# Patient Record
Sex: Male | Born: 1950
Health system: Southern US, Community
[De-identification: ages and names within clinical notes are randomized; demographics above are authoritative.]

## PROBLEM LIST (undated history)

## (undated) DIAGNOSIS — G4730 Sleep apnea, unspecified: Secondary | ICD-10-CM

## (undated) DIAGNOSIS — I4891 Unspecified atrial fibrillation: Secondary | ICD-10-CM

## (undated) DIAGNOSIS — M1990 Unspecified osteoarthritis, unspecified site: Secondary | ICD-10-CM

## (undated) DIAGNOSIS — K219 Gastro-esophageal reflux disease without esophagitis: Secondary | ICD-10-CM

## (undated) DIAGNOSIS — T8859XA Other complications of anesthesia, initial encounter: Secondary | ICD-10-CM

## (undated) DIAGNOSIS — F419 Anxiety disorder, unspecified: Secondary | ICD-10-CM

## (undated) DIAGNOSIS — I10 Essential (primary) hypertension: Secondary | ICD-10-CM

## (undated) HISTORY — DX: Unspecified atrial fibrillation: I48.91

## (undated) HISTORY — DX: Gastro-esophageal reflux disease without esophagitis: K21.9

---

## 2004-12-05 ENCOUNTER — Emergency Department: Payer: Self-pay | Admitting: Emergency Medicine

## 2004-12-16 ENCOUNTER — Emergency Department: Payer: Self-pay | Admitting: Emergency Medicine

## 2004-12-16 ENCOUNTER — Other Ambulatory Visit: Payer: Self-pay

## 2005-12-26 ENCOUNTER — Emergency Department (HOSPITAL_COMMUNITY): Admission: EM | Admit: 2005-12-26 | Discharge: 2005-12-26 | Payer: Self-pay | Admitting: Emergency Medicine

## 2008-05-04 IMAGING — CR DG FOOT COMPLETE 3+V*L*
3 series · 3 of 3 positions shown · non-contrast
Comparison: none

CLINICAL DATA: Stepped off a dock and heard a rip in foot.  Posterior foot pain from heel to toes.   
 LEFT FOOT - 3 VIEW:

[view not recorded (1 of 3)]
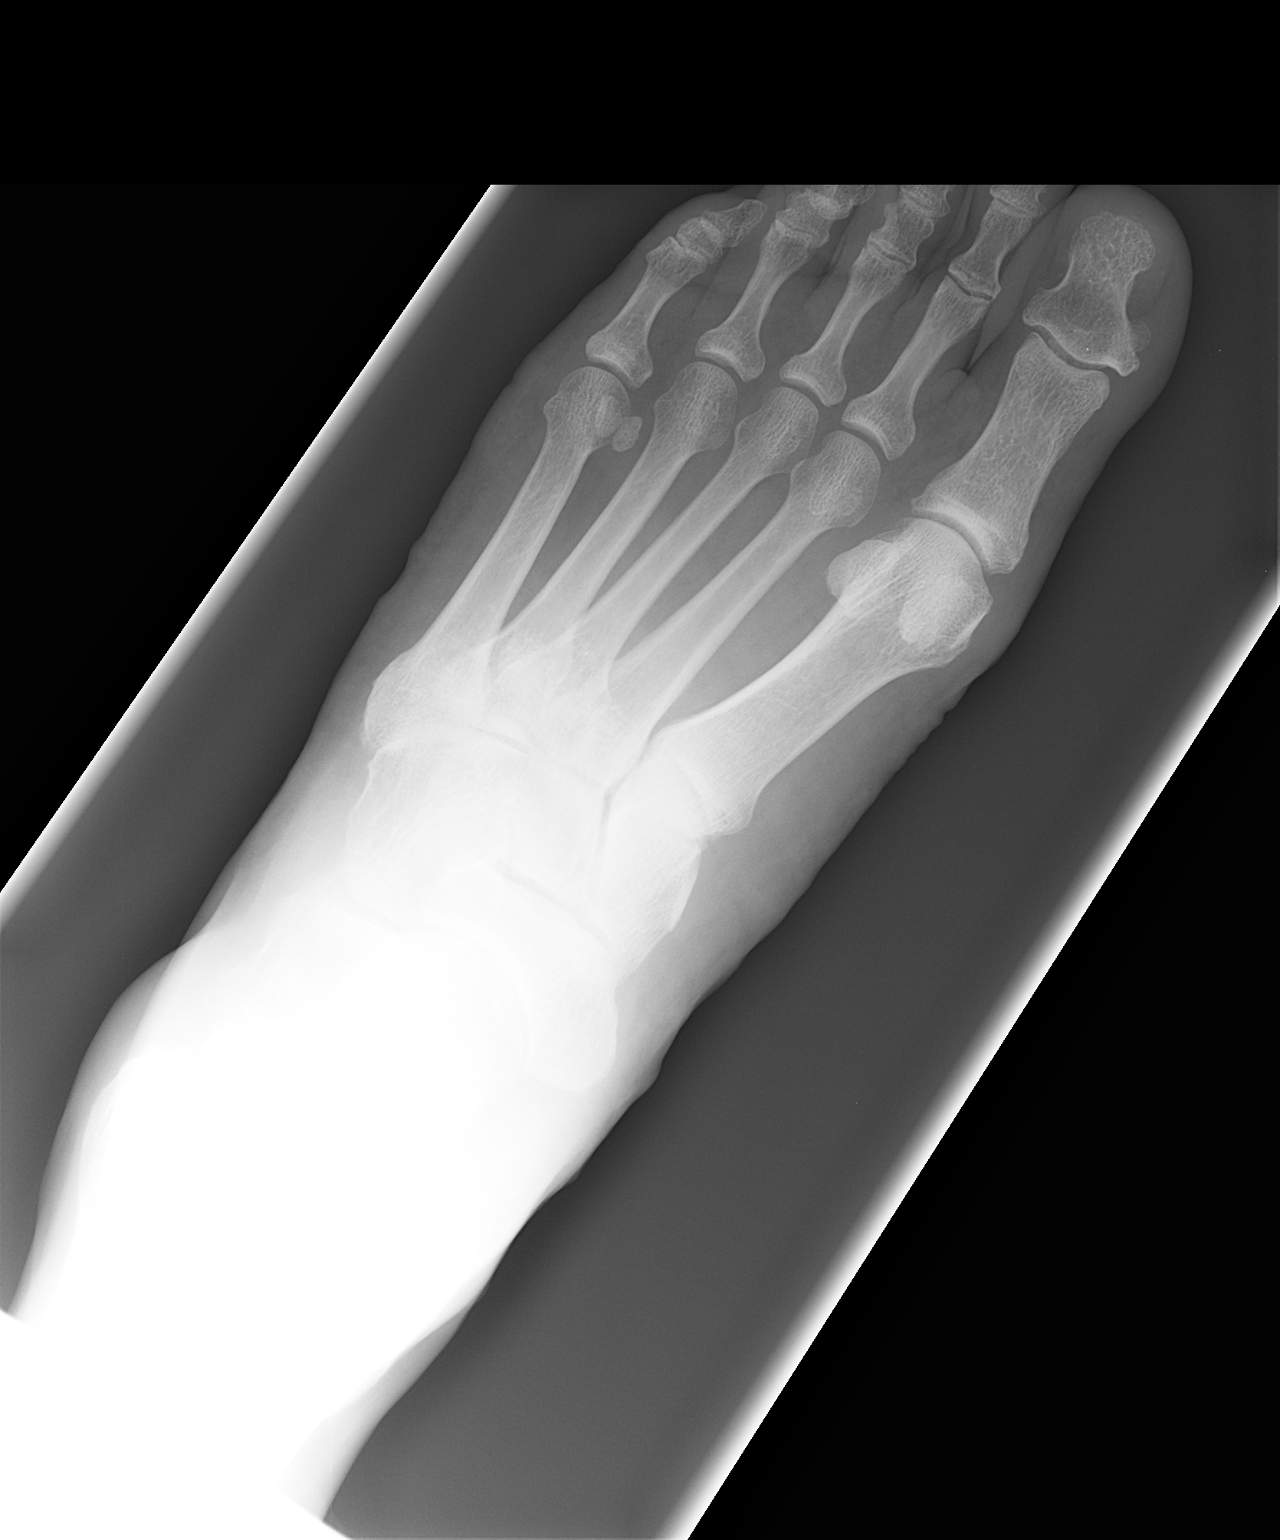

[view not recorded (2 of 3)]
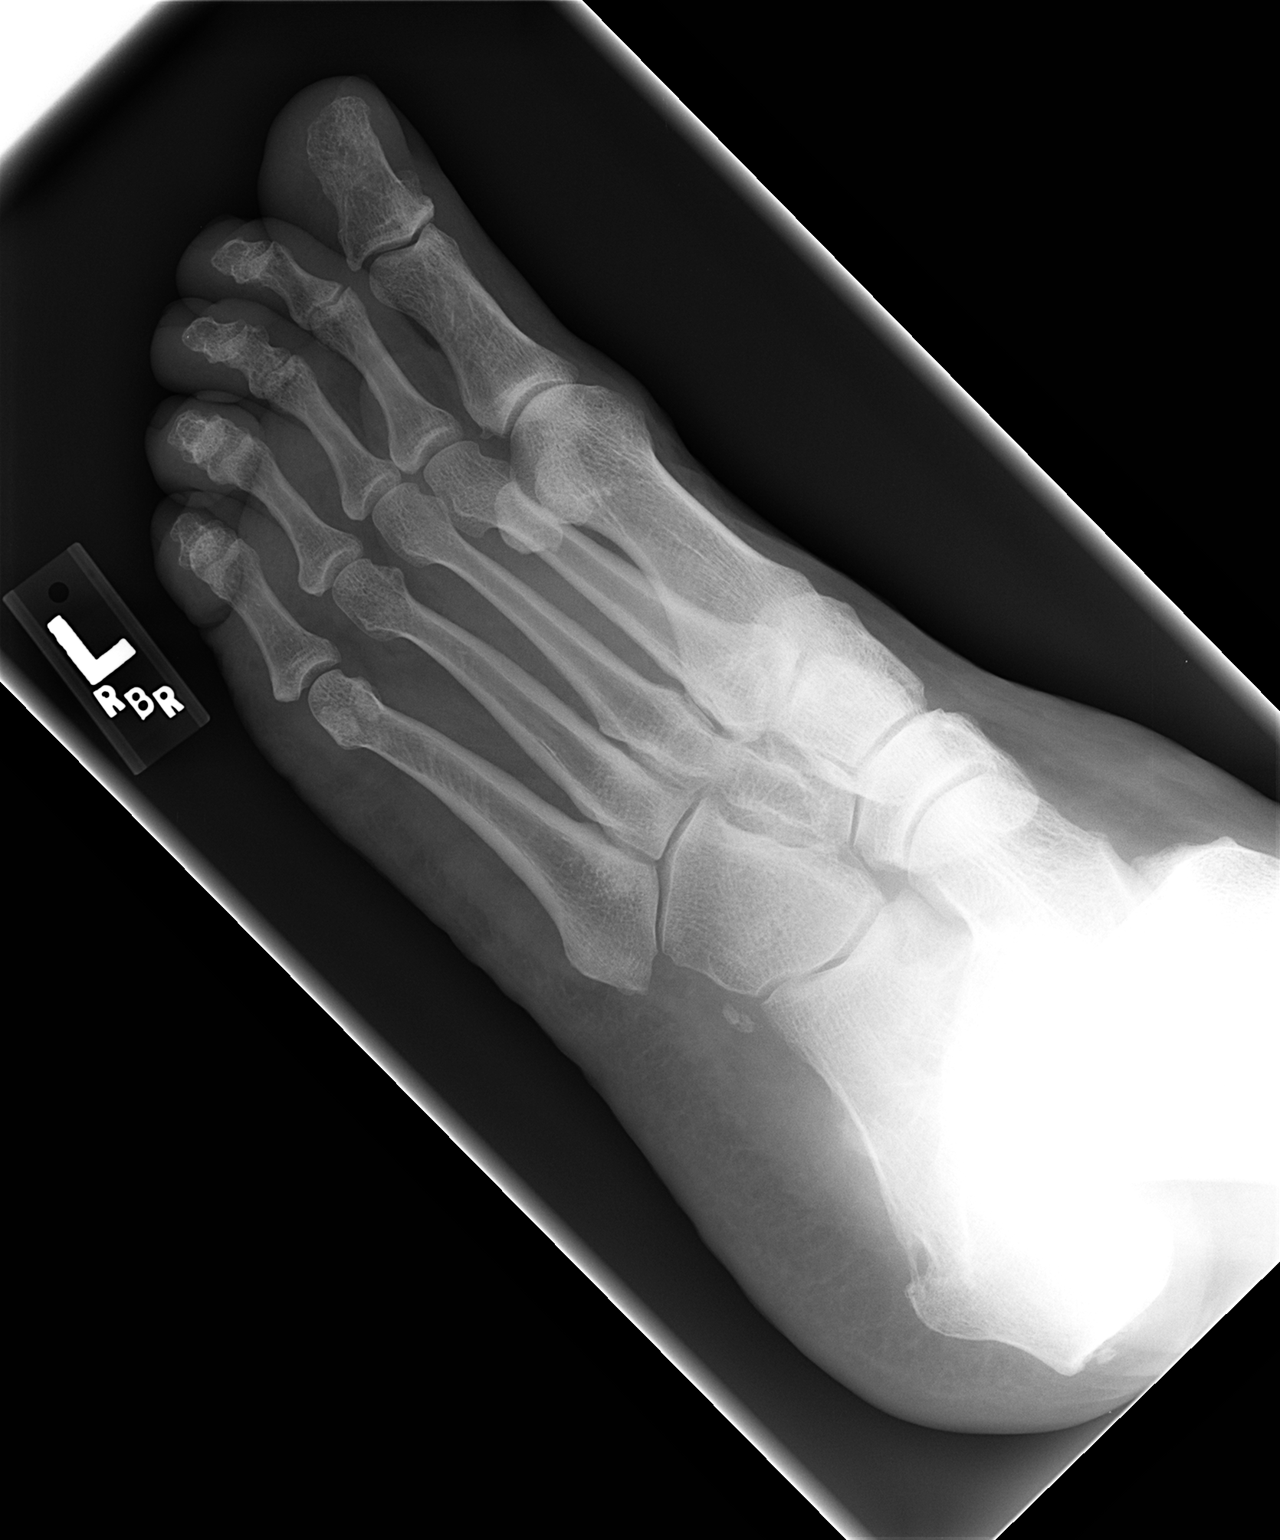

[view not recorded (3 of 3)]
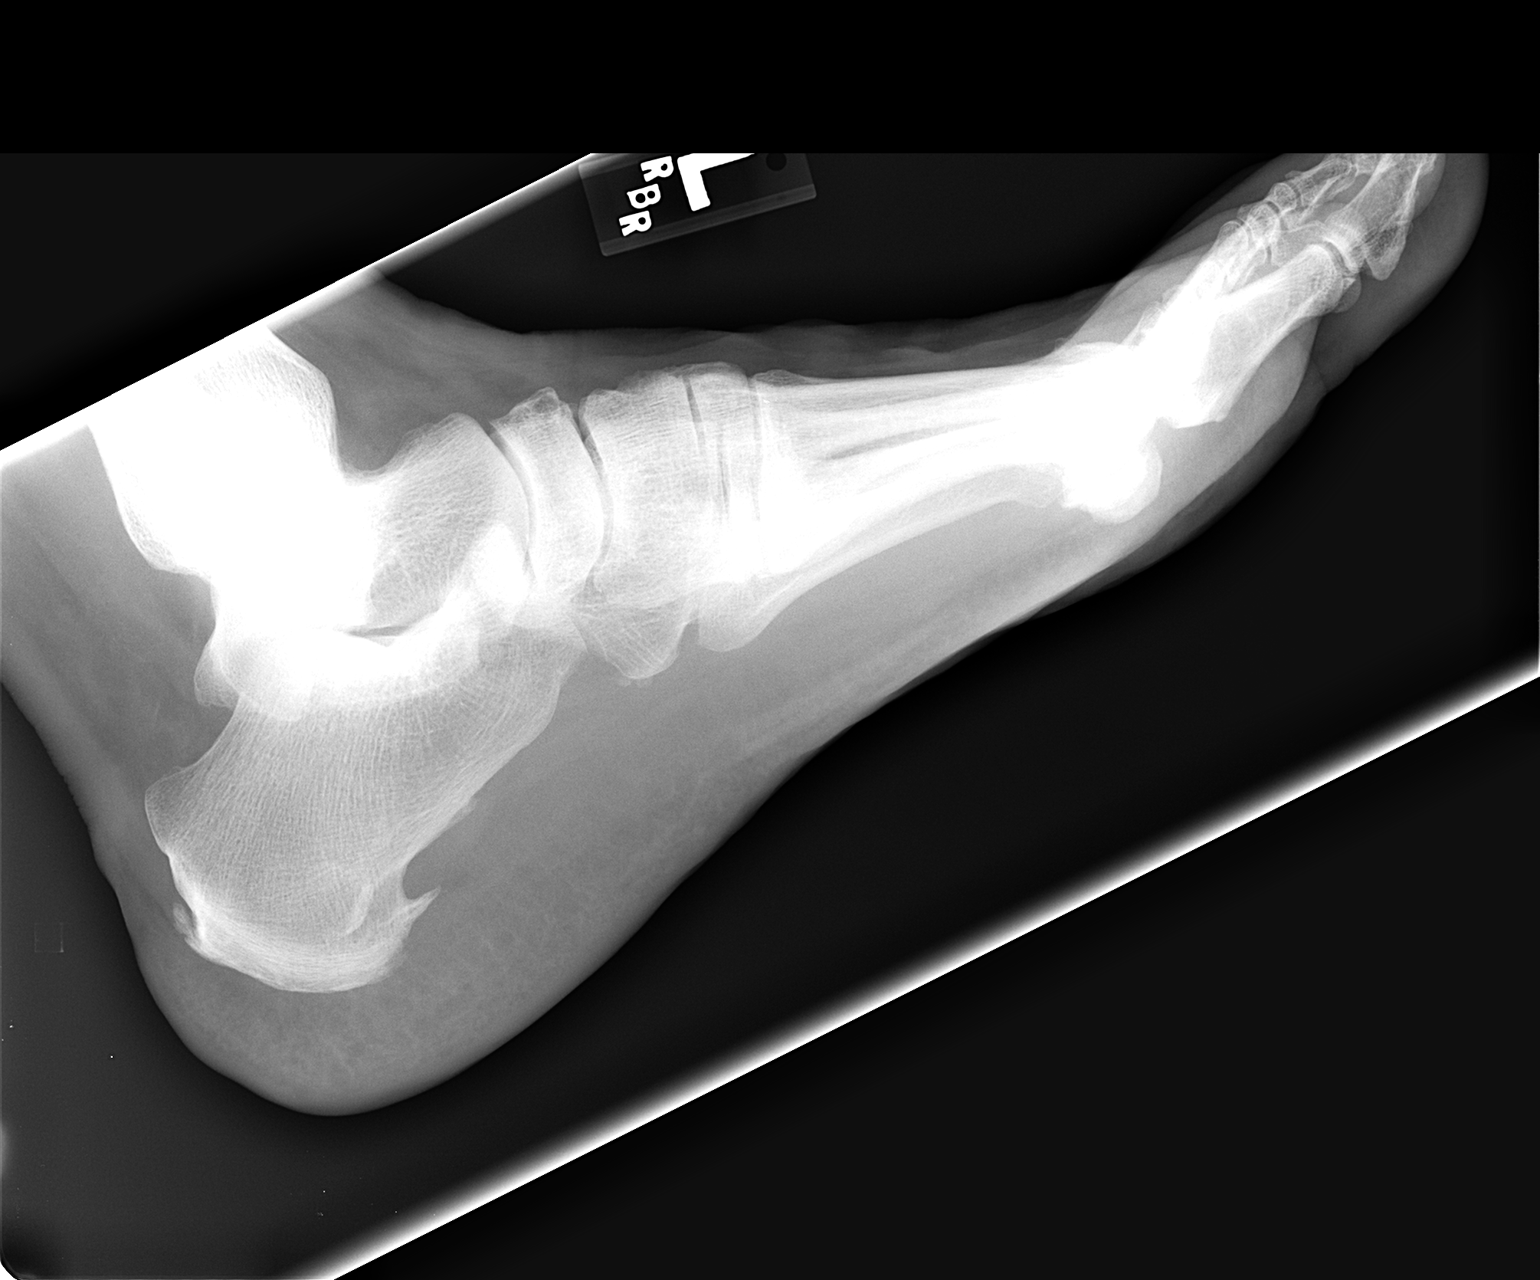

[3 of 3 positions shown; findings below may reference images not displayed]

FINDINGS: There is no acute radiographic abnormality.  Specifically, I see no fracture or dislocation.  If there is a concern for ligamentous injury an MRI should be obtained.   There are calcaneal and plantar heel spurs.
IMPRESSION: No acute osseous abnormalities.

## 2011-01-29 HISTORY — PX: CHOLECYSTECTOMY: SHX55

## 2016-06-09 DIAGNOSIS — R42 Dizziness and giddiness: Secondary | ICD-10-CM | POA: Diagnosis not present

## 2016-06-16 DIAGNOSIS — R42 Dizziness and giddiness: Secondary | ICD-10-CM | POA: Diagnosis not present

## 2016-06-16 DIAGNOSIS — H903 Sensorineural hearing loss, bilateral: Secondary | ICD-10-CM | POA: Diagnosis not present

## 2016-12-15 DIAGNOSIS — L739 Follicular disorder, unspecified: Secondary | ICD-10-CM | POA: Diagnosis not present

## 2016-12-15 DIAGNOSIS — D1801 Hemangioma of skin and subcutaneous tissue: Secondary | ICD-10-CM | POA: Diagnosis not present

## 2016-12-15 DIAGNOSIS — B351 Tinea unguium: Secondary | ICD-10-CM | POA: Diagnosis not present

## 2016-12-15 DIAGNOSIS — D229 Melanocytic nevi, unspecified: Secondary | ICD-10-CM | POA: Diagnosis not present

## 2016-12-15 DIAGNOSIS — L821 Other seborrheic keratosis: Secondary | ICD-10-CM | POA: Diagnosis not present

## 2016-12-15 DIAGNOSIS — B078 Other viral warts: Secondary | ICD-10-CM | POA: Diagnosis not present

## 2016-12-15 DIAGNOSIS — L814 Other melanin hyperpigmentation: Secondary | ICD-10-CM | POA: Diagnosis not present

## 2016-12-15 DIAGNOSIS — D485 Neoplasm of uncertain behavior of skin: Secondary | ICD-10-CM | POA: Diagnosis not present

## 2016-12-15 DIAGNOSIS — L570 Actinic keratosis: Secondary | ICD-10-CM | POA: Diagnosis not present

## 2016-12-15 DIAGNOSIS — B353 Tinea pedis: Secondary | ICD-10-CM | POA: Diagnosis not present

## 2016-12-15 DIAGNOSIS — L578 Other skin changes due to chronic exposure to nonionizing radiation: Secondary | ICD-10-CM | POA: Diagnosis not present

## 2016-12-22 DIAGNOSIS — L739 Follicular disorder, unspecified: Secondary | ICD-10-CM | POA: Diagnosis not present

## 2017-06-06 ENCOUNTER — Ambulatory Visit: Payer: Self-pay | Admitting: Family Medicine

## 2017-06-06 ENCOUNTER — Ambulatory Visit (INDEPENDENT_AMBULATORY_CARE_PROVIDER_SITE_OTHER): Payer: Medicare Other | Admitting: Family Medicine

## 2017-06-06 ENCOUNTER — Encounter: Payer: Self-pay | Admitting: Family Medicine

## 2017-06-06 VITALS — BP 108/60 | HR 70 | Temp 98.8°F | Resp 18 | Ht 71.5 in | Wt 294.0 lb

## 2017-06-06 DIAGNOSIS — M5432 Sciatica, left side: Secondary | ICD-10-CM

## 2017-06-06 MED ORDER — NAPROXEN 500 MG PO TABS: 500.0000 mg | ORAL_TABLET | Freq: Two times a day (BID) | ORAL | 0 refills | Status: AC

## 2017-06-06 MED ORDER — CYCLOBENZAPRINE HCL 5 MG PO TABS: 5.0000 mg | ORAL_TABLET | Freq: Three times a day (TID) | ORAL | 1 refills | Status: AC | PRN

## 2017-06-06 NOTE — Progress Notes (Signed)
Patient: Clinton Hurley Male    DOB: 11-02-1950   67 y.o.   MRN: [id number] Visit Date: 06/06/2017  Today's Provider: Lelon Huh, MD   Chief Complaint  Patient presents with  . Back Pain    x 3 weeks   Subjective:     This is former patient who present today to re-establish and for evaluation of back pain. He has been going to [place] for routine medical care. Was last seen here in January 2016. His medication, social, and family history were reviewed today.   Back Pain  This is a recurrent problem. Episode onset: 3 weeks ago. The problem occurs constantly. The problem has been gradually worsening since onset. The pain is present in the lumbar spine. The quality of the pain is described as shooting and aching. The pain radiates to the left thigh. The pain is severe. Exacerbated by: certain movements and walking. Pertinent negatives include no abdominal pain, chest pain or fever. He has tried nothing for the symptoms. The treatment provided moderate relief.   03/18/2014-seen by Vernie Murders for right sided low back pain. Treated with naproxen and flexeril which was apparently effective. He states current episode is identical to the episode in 2016 and has had no other episodes between.    Allergies not on file   Current Outpatient Medications:  .  alfuzosin (UROXATRAL) 10 MG 24 hr tablet, Take 10 mg by mouth daily., Disp: , Rfl:  .  omeprazole (PRILOSEC) 20 MG capsule, Take 1 capsule by mouth every morning. Take 30 minutes before meal, Disp: , Rfl:  .  sulindac (CLINORIL) 200 MG tablet, Take 200 mg by mouth 2 (two) times daily., Disp: , Rfl:   Review of Systems  Constitutional: Negative for appetite change, chills and fever.  Respiratory: Negative for chest tightness, shortness of breath and wheezing.   Cardiovascular: Negative for chest pain and palpitations.  Gastrointestinal: Negative for abdominal pain, nausea and vomiting.  Musculoskeletal: Positive for back pain and  myalgias (shooting pain in left leg).       Swelling of the back muscles   Active Ambulatory Problems    Diagnosis Date Noted  . No Active Ambulatory Problems   Resolved Ambulatory Problems    Diagnosis Date Noted  . No Resolved Ambulatory Problems   Past Medical History:  Diagnosis Date  . Atrial fibrillation ([place])   . GERD (gastroesophageal reflux disease)    Family History  Problem Relation Age of Onset  . Lung cancer Mother    Family Status  Relation Name Status  . Mother  Deceased  . Father  Alive      Social History   Tobacco Use  . Smoking status: Former Research scientist (life sciences)  . Smokeless tobacco: Never Used  Substance Use Topics  . Alcohol use: Not Currently   Objective:   BP 108/60 (BP Location: Left Arm, Patient Position: Sitting, Cuff Size: Large)   Pulse 70   Temp 98.8 F (37.1 C) (Oral)   Resp 18   Ht 5' 11.5" (1.816 m)   Wt 294 lb (133.4 kg)   SpO2 95% Comment: room air  BMI 40.43 kg/m     Physical Exam  General appearance: alert, well developed, well nourished, cooperative and in no distress Head: Normocephalic, without obvious abnormality, atraumatic Respiratory: Respirations even and unlabored, normal respiratory rate MS: Tender left para lumbar muscles. No spine tenderness. +5 strength both LEs.      Assessment & Plan:  1. Sciatica of left side  - cyclobenzaprine (FLEXERIL) 5 MG tablet; Take 1-2 tablets (5-10 mg total) by mouth 3 (three) times daily as needed for up to 10 days (back pain).  Dispense: 30 tablet; Refill: 1 - naproxen (NAPROSYN) 500 MG tablet; Take 1 tablet (500 mg total) by mouth 2 (two) times daily with a meal for 10 days.  Dispense: 20 tablet; Refill: 0  Call if symptoms change or if not rapidly improving.          Lelon Huh, MD  [facility]

## 2017-06-06 NOTE — Patient Instructions (Signed)
Call if you are not doing much better in 3-4 days   Sciatica Sciatica is pain, numbness, weakness, or tingling along your sciatic nerve. The sciatic nerve starts in the lower back and goes down the back of each leg. Sciatica happens when this nerve is pinched or has pressure put on it. Sciatica usually goes away on its own or with treatment. Sometimes, sciatica may keep coming back (recur). Follow these instructions at home: Medicines  Take over-the-counter and prescription medicines only as told by your doctor.  Do not drive or use heavy machinery while taking prescription pain medicine. Managing pain  If directed, put ice on the affected area. ? Put ice in a plastic bag. ? Place a towel between your skin and the bag. ? Leave the ice on for 20 minutes, 2-3 times a day.  After icing, apply heat to the affected area before you exercise or as often as told by your doctor. Use the heat source that your doctor tells you to use, such as a moist heat pack or a heating pad. ? Place a towel between your skin and the heat source. ? Leave the heat on for 20-30 minutes. ? Remove the heat if your skin turns bright red. This is especially important if you are unable to feel pain, heat, or cold. You may have a greater risk of getting burned. Activity  Return to your normal activities as told by your doctor. Ask your doctor what activities are safe for you. ? Avoid activities that make your sciatica worse.  Take short rests during the day. Rest in a lying or standing position. This is usually better than sitting to rest. ? When you rest for a long time, do some physical activity or stretching between periods of rest. ? Avoid sitting for a long time without moving. Get up and move around at least one time each hour.  Exercise and stretch regularly, as told by your doctor.  Do not lift anything that is heavier than 10 lb (4.5 kg) while you have symptoms of sciatica. ? Avoid lifting heavy things even  when you do not have symptoms. ? Avoid lifting heavy things over and over.  When you lift objects, always lift in a way that is safe for your body. To do this, you should: ? Bend your knees. ? Keep the object close to your body. ? Avoid twisting. General instructions  Use good posture. ? Avoid leaning forward when you are sitting. ? Avoid hunching over when you are standing.  Stay at a healthy weight.  Wear comfortable shoes that support your feet. Avoid wearing high heels.  Avoid sleeping on a mattress that is too soft or too hard. You might have less pain if you sleep on a mattress that is firm enough to support your back.  Keep all follow-up visits as told by your doctor. This is important. Contact a doctor if:  You have pain that: ? Wakes you up when you are sleeping. ? Gets worse when you lie down. ? Is worse than the pain you have had in the past. ? Lasts longer than 4 weeks.  You lose weight for without trying. Get help right away if:  You cannot control when you pee (urinate) or poop (have a bowel movement).  You have weakness in any of these areas and it gets worse. ? Lower back. ? Lower belly (pelvis). ? Butt (buttocks). ? Legs.  You have redness or swelling of your back.  You have  a burning feeling when you pee. This information is not intended to replace advice given to you by your health care provider. Make sure you discuss any questions you have with your health care provider. Document Released: 11/24/2007 Document Revised: 07/23/2015 Document Reviewed: 10/24/2014 Elsevier Interactive Patient Education  [employer].

## 2017-11-15 ENCOUNTER — Ambulatory Visit (INDEPENDENT_AMBULATORY_CARE_PROVIDER_SITE_OTHER): Payer: Medicare Other | Admitting: Family Medicine

## 2017-11-15 ENCOUNTER — Encounter: Payer: Self-pay | Admitting: Family Medicine

## 2017-11-15 VITALS — BP 136/74 | HR 70 | Temp 99.0°F | Resp 16 | Ht 73.0 in | Wt 299.0 lb

## 2017-11-15 DIAGNOSIS — L559 Sunburn, unspecified: Secondary | ICD-10-CM

## 2017-11-15 NOTE — Patient Instructions (Signed)
1.Topical Caladryl once daily.  2. Continue Aloe and cool compresses.  3. Start Zyrtec 10mg  daily as needed. You can get this over the counter.  4. Start Benadryl 25mg  every 4 hours as needed for itching.

## 2017-11-15 NOTE — Progress Notes (Signed)
Patient: Clinton Hurley Male    DOB: Jul 09, 1950   67 y.o.   MRN: [id number] Visit Date: 11/15/2017  Today's Provider: Wilhemena Durie, MD   Chief Complaint  Patient presents with  . Rash   Subjective:    Rash  This is a new problem. The current episode started in the past 7 days. The problem has been gradually worsening since onset. The affected locations include the left arm, right arm, left lower leg, right lower leg and neck. The rash is characterized by dryness, itchiness and redness. Associated with: sun exposure. Pertinent negatives include no cough or shortness of breath. Past treatments include anti-itch cream (and aloe). The treatment provided mild relief.       No Known Allergies   Current Outpatient Medications:  .  alfuzosin (UROXATRAL) 10 MG 24 hr tablet, Take 10 mg by mouth daily., Disp: , Rfl:  .  atorvastatin (LIPITOR) 40 MG tablet, Take 1 tablet by mouth daily., Disp: , Rfl:  .  clonazePAM (KLONOPIN) 1 MG tablet, Take 1 mg by mouth 2 (two) times daily., Disp: , Rfl:  .  dronedarone (MULTAQ) 400 MG tablet, Take 400 mg by mouth 2 (two) times daily with a meal., Disp: , Rfl:  .  omeprazole (PRILOSEC) 20 MG capsule, Take 1 capsule by mouth every morning. Take 30 minutes before meal, Disp: , Rfl:  .  sulindac (CLINORIL) 200 MG tablet, Take 200 mg by mouth 2 (two) times daily., Disp: , Rfl:   Review of Systems  Constitutional: Negative.   Eyes: Negative.   Respiratory: Negative for cough and shortness of breath.   Cardiovascular: Negative for chest pain, palpitations and leg swelling.  Endocrine: Negative.   Musculoskeletal: Negative for arthralgias and gait problem.  Skin: Positive for rash.  Allergic/Immunologic: Negative.   Psychiatric/Behavioral: Negative.     Social History   Tobacco Use  . Smoking status: Former Research scientist (life sciences)  . Smokeless tobacco: Never Used  Substance Use Topics  . Alcohol use: Not Currently   Objective:   BP 136/74 (BP  Location: Right Arm, Patient Position: Sitting, Cuff Size: Large)   Pulse 70   Temp 99 F (37.2 C)   Resp 16   Ht 6\' 1"  (1.854 m)   Wt 299 lb (135.6 kg)   SpO2 95%   BMI 39.45 kg/m  Vitals:   11/15/17 1614  BP: 136/74  Pulse: 70  Resp: 16  Temp: 99 F (37.2 C)  SpO2: 95%  Weight: 299 lb (135.6 kg)  Height: 6\' 1"  (1.854 m)     Physical Exam  Constitutional: He is oriented to person, place, and time. He appears well-developed and well-nourished.  HENT:  Head: Normocephalic and atraumatic.  Eyes: Conjunctivae are normal.  Neck: No thyromegaly present.  Cardiovascular: Normal rate, regular rhythm and normal heart sounds.  Pulmonary/Chest: Effort normal.  Musculoskeletal: He exhibits no edema.  Lymphadenopathy:    He has no cervical adenopathy.  Neurological: He is alert and oriented to person, place, and time.  Skin: Skin is warm and dry.  Very mild,nonspecific rash in areas noted in HPI.Erythematous with a slight amount of blistering.  Psychiatric: He has a normal mood and affect. His behavior is normal. Judgment and thought content normal.        Assessment & Plan:     1. Sunburn First degree. Treat topically with cool compresses,aloe,caladryl and prn benadryl.      I have done the exam and reviewed  the above chart and it is accurate to the best of my knowledge. Development worker, community has been used in this note in any air is in the dictation or transcription are unintentional.  Wilhemena Durie, MD  [place]

## 2018-03-02 ENCOUNTER — Encounter: Payer: Self-pay | Admitting: Podiatry

## 2018-03-02 ENCOUNTER — Ambulatory Visit (INDEPENDENT_AMBULATORY_CARE_PROVIDER_SITE_OTHER): Payer: Medicare Other | Admitting: Podiatry

## 2018-03-02 VITALS — BP 154/85 | HR 70

## 2018-03-02 DIAGNOSIS — L600 Ingrowing nail: Secondary | ICD-10-CM

## 2018-03-02 DIAGNOSIS — M79676 Pain in unspecified toe(s): Secondary | ICD-10-CM | POA: Diagnosis not present

## 2018-03-02 DIAGNOSIS — B351 Tinea unguium: Secondary | ICD-10-CM | POA: Diagnosis not present

## 2018-03-05 NOTE — Progress Notes (Signed)
   Subjective: Patient presents today for evaluation of pain to the medial border of the right hallux that began 6-8 months ago. Patient is concerned for possible ingrown nail. Applying pressure to the nail increases the pain. He has not done anything for treatment. Patient presents today for further treatment and evaluation.  Past Medical History:  Diagnosis Date  . Atrial fibrillation ([place])   . GERD (gastroesophageal reflux disease)     Objective:  General: Well developed, nourished, in no acute distress, alert and oriented x3   Dermatology: Skin is warm, dry and supple bilateral. Medial border of the right hallux appears to be erythematous with evidence of an ingrowing nail. Pain on palpation noted to the border of the nail fold. The remaining nails appear unremarkable at this time. There are no open sores, lesions.  Vascular: Dorsalis Pedis artery and Posterior Tibial artery pedal pulses palpable. No lower extremity edema noted.   Neruologic: Grossly intact via light touch bilateral.  Musculoskeletal: Muscular strength within normal limits in all groups bilateral. Normal range of motion noted to all pedal and ankle joints.   Assesement: #1 Paronychia with ingrowing nail medial border right hallux  #2 Pain in toe #3 Incurvated nail  Plan of Care:  1. Patient evaluated.  2. Mechanical debridement of the right great toenail performed using a nail nipper. Filed with dremel without incident.  3. Return to clinic as needed.     Edrick Kins, DPM [facility]  Dr. Edrick Kins, [place]                                        [place], [place] [address]                Office [phone]  Fax [phone]

## 2018-10-01 ENCOUNTER — Other Ambulatory Visit: Payer: Self-pay

## 2018-11-14 DIAGNOSIS — M1611 Unilateral primary osteoarthritis, right hip: Secondary | ICD-10-CM | POA: Diagnosis not present

## 2018-11-14 DIAGNOSIS — M25561 Pain in right knee: Secondary | ICD-10-CM | POA: Diagnosis not present

## 2018-11-14 DIAGNOSIS — M2391 Unspecified internal derangement of right knee: Secondary | ICD-10-CM | POA: Diagnosis not present

## 2019-08-12 ENCOUNTER — Ambulatory Visit: Payer: Medicare Other | Admitting: Family Medicine

## 2019-08-12 ENCOUNTER — Other Ambulatory Visit: Payer: Self-pay

## 2019-08-12 VITALS — BP 123/52 | HR 73 | Temp 97.3°F | Ht 73.0 in | Wt 289.2 lb

## 2019-08-12 DIAGNOSIS — Z024 Encounter for examination for driving license: Secondary | ICD-10-CM | POA: Diagnosis not present

## 2019-08-12 LAB — POCT URINALYSIS DIPSTICK
Bilirubin, UA: NEGATIVE
Glucose, UA: NEGATIVE
Ketones, UA: NEGATIVE
Leukocytes, UA: NEGATIVE
Nitrite, UA: NEGATIVE
Protein, UA: NEGATIVE
Spec Grav, UA: 1.015 (ref 1.010–1.025)
Urobilinogen, UA: 0.2 E.U./dL
pH, UA: 5 (ref 5.0–8.0)

## 2019-08-12 NOTE — Patient Instructions (Addendum)
DOT Certificate provided for 1 year.  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  Cardiac clearance received from Dr. Raylene Everts ([facility]for his [facility] fibrillation  CPAP compliance letter received from Glennon Hamilton, NP with the [facility]

## 2019-08-12 NOTE — Assessment & Plan Note (Addendum)
DOT PE certificate provided x 1 year

## 2019-08-12 NOTE — Progress Notes (Addendum)
Subjective:    Patient ID: Clinton Hurley, male    DOB: 1950-03-16, 69 y.o.   MRN: [id number]  Clinton Hurley is a 69 y.o. male presenting on 08/12/2019 for Employment Physical (DOT Physical)   HPI  Mr. Gaxiola presents to clinic for DOT PE.  No flowsheet data found.  Social History   Tobacco Use  . Smoking status: Former Research scientist (life sciences)  . Smokeless tobacco: Never Used  Substance Use Topics  . Alcohol use: Not Currently  . Drug use: Never    Review of Systems  Constitutional: Negative.   HENT: Negative.   Eyes: Negative.   Respiratory: Negative.   Cardiovascular: Negative.   Gastrointestinal: Negative.   Endocrine: Negative.   Genitourinary: Negative.   Musculoskeletal: Negative.   Skin: Negative.   Allergic/Immunologic: Negative.   Neurological: Negative.   Hematological: Negative.   Psychiatric/Behavioral: Negative.    Per HPI unless specifically indicated above     Objective:    BP (!) 123/52 (BP Location: Left Arm, Patient Position: Sitting, Cuff Size: Large)   Pulse 73   Temp (!) 97.3 F (36.3 C) (Temporal)   Ht 6\' 1"  (1.854 m)   Wt 289 lb 3.2 oz (131.2 kg)   BMI 38.16 kg/m   Wt Readings from Last 3 Encounters:  08/12/19 289 lb 3.2 oz (131.2 kg)  11/15/17 299 lb (135.6 kg)  06/06/17 294 lb (133.4 kg)    Physical Exam Vitals reviewed.  Constitutional:      General: He is not in acute distress.    Appearance: Normal appearance. He is well-developed and well-groomed. He is obese. He is not ill-appearing or toxic-appearing.  HENT:     Head: Normocephalic.     Right Ear: Tympanic membrane, ear canal and external ear normal. There is no impacted cerumen.     Left Ear: Tympanic membrane, ear canal and external ear normal. There is no impacted cerumen.     Nose: Nose normal. No congestion or rhinorrhea.     Mouth/Throat:     Mouth: Mucous membranes are moist.     Pharynx: Oropharynx is clear. No oropharyngeal exudate or posterior oropharyngeal erythema.   Eyes:     General: Lids are normal. Vision grossly intact. No scleral icterus.       Right eye: No discharge.        Left eye: No discharge.     Extraocular Movements: Extraocular movements intact.     Conjunctiva/sclera: Conjunctivae normal.     Pupils: Pupils are equal, round, and reactive to light.  Cardiovascular:     Rate and Rhythm: Normal rate and regular rhythm.     Pulses: Normal pulses.          Dorsalis pedis pulses are 2+ on the right side and 2+ on the left side.     Heart sounds: Normal heart sounds. No murmur heard.  No friction rub. No gallop.   Pulmonary:     Effort: Pulmonary effort is normal. No respiratory distress.     Breath sounds: Normal breath sounds. No wheezing, rhonchi or rales.  Abdominal:     General: Abdomen is flat. Bowel sounds are normal. There is no distension.     Palpations: Abdomen is soft. There is no hepatomegaly, splenomegaly or mass.     Tenderness: There is no abdominal tenderness. There is no right CVA tenderness, left CVA tenderness, guarding or rebound.     Hernia: No hernia is present.  Musculoskeletal:  General: Normal range of motion.     Cervical back: Normal range of motion and neck supple. No rigidity or tenderness.     Right lower leg: No edema.     Left lower leg: No edema.  Feet:     Right foot:     Skin integrity: Skin integrity normal.     Left foot:     Skin integrity: Skin integrity normal.  Lymphadenopathy:     Cervical: No cervical adenopathy.  Skin:    General: Skin is warm and dry.     Capillary Refill: Capillary refill takes less than 2 seconds.  Neurological:     General: No focal deficit present.     Mental Status: He is alert and oriented to person, place, and time.     Cranial Nerves: Cranial nerves are intact. No cranial nerve deficit.     Sensory: Sensation is intact. No sensory deficit.     Motor: Motor function is intact. No weakness.     Coordination: Coordination is intact. Coordination normal.      Gait: Gait is intact. Gait normal.     Deep Tendon Reflexes: Reflexes are normal and symmetric. Reflexes normal.  Psychiatric:        Attention and Perception: Attention and perception normal.        Mood and Affect: Mood and affect normal.        Speech: Speech normal.        Behavior: Behavior normal. Behavior is cooperative.        Thought Content: Thought content normal.        Cognition and Memory: Cognition and memory normal.        Judgment: Judgment normal.     Results for orders placed or performed in visit on 08/12/19  POCT Urinalysis Dipstick  Result Value Ref Range   Color, UA Yellow    Clarity, UA clear    Glucose, UA Negative Negative   Bilirubin, UA negative    Ketones, UA negative    Spec Grav, UA 1.015 1.010 - 1.025   Blood, UA trace    pH, UA 5.0 5.0 - 8.0   Protein, UA Negative Negative   Urobilinogen, UA 0.2 0.2 or 1.0 E.U./dL   Nitrite, UA negative    Leukocytes, UA Negative Negative   Appearance     Odor        Assessment & Plan:   Problem List Items Addressed This Visit      Other   Encounter for commercial driving license (CDL) exam    DOT PE certificate provided x 1 year       Other Visit Diagnoses    Encounter for CDL (commercial driving license) exam    -  Primary   Relevant Orders   POCT Urinalysis Dipstick (Completed)      No orders of the defined types were placed in this encounter.     Follow up plan: Return in about 1 year (around 08/11/2020) for DOT PE.   Harlin Rain, [place] [facility] Nurse Practitioner [facility] 08/22/2019, 9:40 AM

## 2020-03-19 DIAGNOSIS — M25561 Pain in right knee: Secondary | ICD-10-CM | POA: Diagnosis not present

## 2020-03-19 DIAGNOSIS — M25551 Pain in right hip: Secondary | ICD-10-CM | POA: Diagnosis not present

## 2020-12-15 ENCOUNTER — Encounter (INDEPENDENT_AMBULATORY_CARE_PROVIDER_SITE_OTHER): Payer: Self-pay

## 2020-12-15 ENCOUNTER — Ambulatory Visit (INDEPENDENT_AMBULATORY_CARE_PROVIDER_SITE_OTHER): Payer: Medicare Other | Admitting: Podiatry

## 2020-12-15 ENCOUNTER — Other Ambulatory Visit: Payer: Self-pay

## 2020-12-15 DIAGNOSIS — M79675 Pain in left toe(s): Secondary | ICD-10-CM | POA: Diagnosis not present

## 2020-12-15 DIAGNOSIS — M79674 Pain in right toe(s): Secondary | ICD-10-CM | POA: Diagnosis not present

## 2020-12-15 DIAGNOSIS — B351 Tinea unguium: Secondary | ICD-10-CM

## 2020-12-15 NOTE — Progress Notes (Signed)
   SUBJECTIVE Patient presents to office today complaining of elongated, thickened nails that cause pain while ambulating in shoes especially to bilateral great toes.  Patient is unable to trim their own nails. Patient is here for further evaluation and treatment.    Past Medical History:  Diagnosis Date   Atrial fibrillation ([place])    GERD (gastroesophageal reflux disease)     OBJECTIVE General Patient is awake, alert, and oriented x 3 and in no acute distress. Derm Skin is dry and supple bilateral. Negative open lesions or macerations. Remaining integument unremarkable. Nails are tender, long, thickened and dystrophic with subungual debris, consistent with onychomycosis, bilateral great toes. No signs of infection noted. Vasc  DP and PT pedal pulses palpable bilaterally. Temperature gradient within normal limits.  Neuro Epicritic and protective threshold sensation grossly intact bilaterally.  Musculoskeletal Exam No symptomatic pedal deformities noted bilateral. Muscular strength within normal limits.  ASSESSMENT 1.  Pain due to onychomycosis of toenails both  PLAN OF CARE 1. Patient evaluated today.  2. Instructed to maintain good pedal hygiene and foot care.  3. Mechanical debridement of nails bilateral great toes performed using a nail nipper. Filed with dremel without incident.  4. Return to clinic in 3 mos.    Edrick Kins, DPM [facility]  Dr. Edrick Kins, DPM    [address], [place] [address]                Office [phone]  Fax [phone]

## 2020-12-23 DIAGNOSIS — H2513 Age-related nuclear cataract, bilateral: Secondary | ICD-10-CM | POA: Diagnosis not present

## 2020-12-23 DIAGNOSIS — H43812 Vitreous degeneration, left eye: Secondary | ICD-10-CM | POA: Diagnosis not present

## 2021-01-12 DIAGNOSIS — B078 Other viral warts: Secondary | ICD-10-CM | POA: Diagnosis not present

## 2021-01-12 DIAGNOSIS — D2262 Melanocytic nevi of left upper limb, including shoulder: Secondary | ICD-10-CM | POA: Diagnosis not present

## 2021-01-12 DIAGNOSIS — D225 Melanocytic nevi of trunk: Secondary | ICD-10-CM | POA: Diagnosis not present

## 2021-01-12 DIAGNOSIS — L821 Other seborrheic keratosis: Secondary | ICD-10-CM | POA: Diagnosis not present

## 2021-01-12 DIAGNOSIS — D2261 Melanocytic nevi of right upper limb, including shoulder: Secondary | ICD-10-CM | POA: Diagnosis not present

## 2021-01-12 DIAGNOSIS — D485 Neoplasm of uncertain behavior of skin: Secondary | ICD-10-CM | POA: Diagnosis not present

## 2021-01-26 DIAGNOSIS — H2513 Age-related nuclear cataract, bilateral: Secondary | ICD-10-CM | POA: Diagnosis not present

## 2021-01-26 DIAGNOSIS — H43812 Vitreous degeneration, left eye: Secondary | ICD-10-CM | POA: Diagnosis not present

## 2021-12-08 NOTE — Patient Instructions (Addendum)
SURGICAL WAITING ROOM VISITATION Patients having surgery or a procedure may have no more than 2 support people in the waiting area - these visitors may rotate.   Children under the age of 40 must have an adult with them who is not the patient. If the patient needs to stay at the hospital during part of their recovery, the visitor guidelines for inpatient rooms apply. Pre-op nurse will coordinate an appropriate time for 1 support person to accompany patient in pre-op.  This support person may not rotate.    Please refer to the [facility] website for the visitor guidelines for Inpatients (after your surgery is over and you are in a regular room).    Your procedure is scheduled on: 12/21/21   Report to [facility] Main Entrance    Report to admitting at 9:00 AM   Call this number if you have problems the morning of surgery [phone]   Do not eat food :After Midnight.   After Midnight you may have the following liquids until 8:30 AM DAY OF SURGERY  Water Non-Citrus Juices (without pulp, NO RED) Carbonated Beverages Black Coffee (NO MILK/CREAM OR CREAMERS, sugar ok)  Clear Tea (NO MILK/CREAM OR CREAMERS, sugar ok) regular and decaf                             Plain Jell-O (NO RED)                                           Fruit ices (not with fruit pulp, NO RED)                                     Popsicles (NO RED)                                                               Sports drinks like Gatorade (NO RED)                 The day of surgery:  Drink ONE (1) Pre-Surgery Clear Ensure at 8:30 AM the morning of surgery. Drink in one sitting. Do not sip.  This drink was given to you during your hospital  pre-op appointment visit. Nothing else to drink after completing the  Pre-Surgery Clear Ensure.          If you have questions, please contact your surgeon's office.   FOLLOW BOWEL PREP AND ANY ADDITIONAL PRE OP INSTRUCTIONS YOU RECEIVED FROM YOUR SURGEON'S  OFFICE!!!     Oral Hygiene is also important to reduce your risk of infection.                                    Remember - BRUSH YOUR TEETH THE MORNING OF SURGERY WITH YOUR REGULAR TOOTHPASTE   Take these medicines the morning of surgery with A SIP OF WATER: Clonazepam, Omeprazole   These are anesthesia recommendations for holding your anticoagulants.  Please contact your prescribing physician to confirm  IF it is safe to hold your anticoagulants for this length of time.   Eliquis Apixaban   72 hours   Xarelto Rivaroxaban   72 hours  Plavix Clopidogrel   120 hours  Pletal Cilostazol   120 hours    Bring CPAP mask and tubing day of surgery.                              You may not have any metal on your body including jewelry, and body piercing             Do not wear lotions, powders, cologne, or deodorant              Men may shave face and neck.   Do not bring valuables to the hospital. [place].   Bring small overnight bag day of surgery.   DO NOT [place]. PHARMACY WILL DISPENSE MEDICATIONS LISTED ON YOUR MEDICATION LIST TO YOU DURING YOUR ADMISSION [place]!              Please read over the following fact sheets you were given: IF [place] [phone] [id number] Schneiders   If you received a COVID test during your pre-op visit  it is requested that you wear a mask when out in public, stay away from anyone that may not be feeling well and notify your surgeon if you develop symptoms. If you test positive for Covid or have been in contact with anyone that has tested positive in the last 10 days please notify you surgeon.     [place] - Preparing for Surgery Before surgery, you can play an important role.  Because skin is not sterile, your skin needs to be as free of germs as possible.  You can reduce the number of germs on your skin by  washing with CHG (chlorahexidine gluconate) soap before surgery.  CHG is an antiseptic cleaner which kills germs and bonds with the skin to continue killing germs even after washing. Please DO NOT use if you have an allergy to CHG or antibacterial soaps.  If your skin becomes reddened/irritated stop using the CHG and inform your nurse when you arrive at Short Stay. Do not shave (including legs and underarms) for at least 48 hours prior to the first CHG shower.  You may shave your face/neck.  Please follow these instructions carefully:  1.  Shower with CHG Soap the night before surgery and the  morning of surgery.  2.  If you choose to wash your hair, wash your hair first as usual with your normal  shampoo.  3.  After you shampoo, rinse your hair and body thoroughly to remove the shampoo.                             4.  Use CHG as you would any other liquid soap.  You can apply chg directly to the skin and wash.  Gently with a scrungie or clean washcloth.  5.  Apply the CHG Soap to your body ONLY FROM THE NECK DOWN.   Do   not use on face/ open  Wound or open sores. Avoid contact with eyes, ears mouth and   genitals (private parts).                       Wash face,  Genitals (private parts) with your normal soap.             6.  Wash thoroughly, paying special attention to the area where your    surgery  will be performed.  7.  Thoroughly rinse your body with warm water from the neck down.  8.  DO NOT shower/wash with your normal soap after using and rinsing off the CHG Soap.                9.  Pat yourself dry with a clean towel.            10.  Wear clean pajamas.            11.  Place clean sheets on your bed the night of your first shower and do not  sleep with pets. Day of Surgery : Do not apply any lotions/deodorants the morning of surgery.  Please wear clean clothes to the hospital/surgery center.  FAILURE TO FOLLOW THESE INSTRUCTIONS MAY RESULT IN THE CANCELLATION  OF YOUR SURGERY  PATIENT SIGNATURE_________________________________  NURSE SIGNATURE__________________________________  ________________________________________________________________________   Adam Phenix  An incentive spirometer is a tool that can help keep your lungs clear and active. This tool measures how well you are filling your lungs with each breath. Taking long deep breaths may help reverse or decrease the chance of developing breathing (pulmonary) problems (especially infection) following: A long period of time when you are unable to move or be active. BEFORE THE PROCEDURE  If the spirometer includes an indicator to show your best effort, your nurse or respiratory therapist will set it to a desired goal. If possible, sit up straight or lean slightly forward. Try not to slouch. Hold the incentive spirometer in an upright position. INSTRUCTIONS FOR USE  Sit on the edge of your bed if possible, or sit up as far as you can in bed or on a chair. Hold the incentive spirometer in an upright position. Breathe out normally. Place the mouthpiece in your mouth and seal your lips tightly around it. Breathe in slowly and as deeply as possible, raising the piston or the ball toward the top of the column. Hold your breath for 3-5 seconds or for as long as possible. Allow the piston or ball to fall to the bottom of the column. Remove the mouthpiece from your mouth and breathe out normally. Rest for a few seconds and repeat Steps 1 through 7 at least 10 times every 1-2 hours when you are awake. Take your time and take a few normal breaths between deep breaths. The spirometer may include an indicator to show your best effort. Use the indicator as a goal to work toward during each repetition. After each set of 10 deep breaths, practice coughing to be sure your lungs are clear. If you have an incision (the cut made at the time of surgery), support your incision when coughing by placing a  pillow or rolled up towels firmly against it. Once you are able to get out of bed, walk around indoors and cough well. You may stop using the incentive spirometer when instructed by your caregiver.  RISKS AND COMPLICATIONS Take your time so you do not get dizzy or light-headed. If you are in pain, you  may need to take or ask for pain medication before doing incentive spirometry. It is harder to take a deep breath if you are having pain. AFTER USE Rest and breathe slowly and easily. It can be helpful to keep track of a log of your progress. Your caregiver can provide you with a simple table to help with this. If you are using the spirometer at home, follow these instructions: [place] IF:  You are having difficultly using the spirometer. You have trouble using the spirometer as often as instructed. Your pain medication is not giving enough relief while using the spirometer. You develop fever of 100.5 F (38.1 C) or higher. SEEK IMMEDIATE MEDICAL CARE IF:  You cough up bloody sputum that had not been present before. You develop fever of 102 F (38.9 C) or greater. You develop worsening pain at or near the incision site. MAKE SURE YOU:  Understand these instructions. Will watch your condition. Will get help right away if you are not doing well or get worse. Document Released: 06/27/2006 Document Revised: 05/09/2011 Document Reviewed: 08/28/2006 ExitCare Patient Information 2014 ExitCare, [place].   ________________________________________________________________________  WHAT IS A BLOOD TRANSFUSION? Blood Transfusion Information  A transfusion is the replacement of blood or some of its parts. Blood is made up of multiple cells which provide different functions. Red blood cells carry oxygen and are used for blood loss replacement. White blood cells fight against infection. Platelets control bleeding. Plasma helps clot blood. Other blood products are available for specialized  needs, such as hemophilia or other clotting disorders. BEFORE THE TRANSFUSION  Who gives blood for transfusions?  Healthy volunteers who are fully evaluated to make sure their blood is safe. This is blood bank blood. Transfusion therapy is the safest it has ever been in the practice of medicine. Before blood is taken from a donor, a complete history is taken to make sure that person has no history of diseases nor engages in risky social behavior (examples are intravenous drug use or sexual activity with multiple partners). The donor's travel history is screened to minimize risk of transmitting infections, such as malaria. The donated blood is tested for signs of infectious diseases, such as HIV and hepatitis. The blood is then tested to be sure it is compatible with you in order to minimize the chance of a transfusion reaction. If you or a relative donates blood, this is often done in anticipation of surgery and is not appropriate for emergency situations. It takes many days to process the donated blood. RISKS AND COMPLICATIONS Although transfusion therapy is very safe and saves many lives, the main dangers of transfusion include:  Getting an infectious disease. Developing a transfusion reaction. This is an allergic reaction to something in the blood you were given. Every precaution is taken to prevent this. The decision to have a blood transfusion has been considered carefully by your caregiver before blood is given. Blood is not given unless the benefits outweigh the risks. AFTER THE TRANSFUSION Right after receiving a blood transfusion, you will usually feel much better and more energetic. This is especially true if your red blood cells have gotten low (anemic). The transfusion raises the level of the red blood cells which carry oxygen, and this usually causes an energy increase. The nurse administering the transfusion will monitor you carefully for complications. HOME CARE INSTRUCTIONS  No special  instructions are needed after a transfusion. You may find your energy is better. Speak with your caregiver about any limitations on  activity for underlying diseases you may have. SEEK MEDICAL CARE IF:  Your condition is not improving after your transfusion. You develop redness or irritation at the intravenous (IV) site. SEEK IMMEDIATE MEDICAL CARE IF:  Any of the following symptoms occur over the next 12 hours: Shaking chills. You have a temperature by mouth above 102 F (38.9 C), not controlled by medicine. Chest, back, or muscle pain. People around you feel you are not acting correctly or are confused. Shortness of breath or difficulty breathing. Dizziness and fainting. You get a rash or develop hives. You have a decrease in urine output. Your urine turns a dark color or changes to pink, red, or brown. Any of the following symptoms occur over the next 10 days: You have a temperature by mouth above 102 F (38.9 C), not controlled by medicine. Shortness of breath. Weakness after normal activity. The white part of the eye turns yellow (jaundice). You have a decrease in the amount of urine or are urinating less often. Your urine turns a dark color or changes to pink, red, or brown. Document Released: 02/12/2000 Document Revised: 05/09/2011 Document Reviewed: 10/01/2007 [facility] Patient Information 2014 [place], [place].  _______________________________________________________________________

## 2021-12-08 NOTE — Progress Notes (Addendum)
COVID Vaccine Completed: yes  Date of COVID positive in last 90 days: no  [facility] [place]  Chest x-ray - n/a EKG - yes in last month [facility] req Stress Test - 5 years ago per pt ECHO - yes [facility] last month req Cardiac Cath - n/a Pacemaker/ICD device last checked: n/a Spinal Cord Stimulator: n/a  Bowel Prep - no  Sleep Study - yes,  CPAP - yes every night  Fasting Blood Sugar - n/a Checks Blood Sugar _____ times a day  Blood Thinner Instructions: Eliquis, hold 3 days Aspirin Instructions: Last Dose:  Activity level: Can go up a flight of stairs and perform activities of daily living without stopping and without symptoms of chest pain or shortness of breath.     Anesthesia review: a fib, HTN, OSA, urinary cath currently from [place] pt states will be removed in 4 days  Patient denies shortness of breath, fever, cough and chest pain at PAT appointment  Patient verbalized understanding of instructions that were given to them at the PAT appointment. Patient was also instructed that they will need to review over the PAT instructions again at home before surgery.

## 2021-12-09 ENCOUNTER — Encounter (HOSPITAL_COMMUNITY): Payer: Self-pay

## 2021-12-09 ENCOUNTER — Encounter (HOSPITAL_COMMUNITY)
Admission: RE | Admit: 2021-12-09 | Discharge: 2021-12-09 | Disposition: A | Discharge status: Home / Self Care | Payer: Medicare Other | Source: Ambulatory Visit | Attending: Orthopedic Surgery | Admitting: Orthopedic Surgery

## 2021-12-09 DIAGNOSIS — I2510 Atherosclerotic heart disease of native coronary artery without angina pectoris: Secondary | ICD-10-CM

## 2021-12-09 DIAGNOSIS — Z01818 Encounter for other preprocedural examination: Secondary | ICD-10-CM

## 2021-12-09 HISTORY — DX: Anxiety disorder, unspecified: F41.9

## 2021-12-09 HISTORY — DX: Sleep apnea, unspecified: G47.30

## 2021-12-09 HISTORY — DX: Essential (primary) hypertension: I10

## 2021-12-09 HISTORY — DX: Other complications of anesthesia, initial encounter: T88.59XA

## 2021-12-17 ENCOUNTER — Encounter (HOSPITAL_COMMUNITY)
Admission: RE | Admit: 2021-12-17 | Discharge: 2021-12-17 | Disposition: A | Discharge status: Home / Self Care | Payer: No Typology Code available for payment source | Source: Ambulatory Visit | Attending: Orthopedic Surgery | Admitting: Orthopedic Surgery

## 2021-12-17 DIAGNOSIS — I2510 Atherosclerotic heart disease of native coronary artery without angina pectoris: Secondary | ICD-10-CM | POA: Diagnosis not present

## 2021-12-17 DIAGNOSIS — Z01818 Encounter for other preprocedural examination: Secondary | ICD-10-CM | POA: Diagnosis not present

## 2021-12-17 DIAGNOSIS — M1611 Unilateral primary osteoarthritis, right hip: Secondary | ICD-10-CM

## 2021-12-17 DIAGNOSIS — R9431 Abnormal electrocardiogram [ECG] [EKG]: Secondary | ICD-10-CM | POA: Diagnosis not present

## 2021-12-17 LAB — CBC
HCT: 37.7 % — ABNORMAL LOW (ref 39.0–52.0)
Hemoglobin: 12.8 g/dL — ABNORMAL LOW (ref 13.0–17.0)
MCH: 31.7 pg (ref 26.0–34.0)
MCHC: 34 g/dL (ref 30.0–36.0)
MCV: 93.3 fL (ref 80.0–100.0)
Platelets: 212 10*3/uL (ref 150–400)
RBC: 4.04 MIL/uL — ABNORMAL LOW (ref 4.22–5.81)
RDW: 12.7 % (ref 11.5–15.5)
WBC: 9.3 10*3/uL (ref 4.0–10.5)
nRBC: 0 % (ref 0.0–0.2)

## 2021-12-17 LAB — BASIC METABOLIC PANEL
Anion gap: 8 (ref 5–15)
BUN: 23 mg/dL (ref 8–23)
CO2: 26 mmol/L (ref 22–32)
Calcium: 8.7 mg/dL — ABNORMAL LOW (ref 8.9–10.3)
Chloride: 103 mmol/L (ref 98–111)
Creatinine, Ser: 1.6 mg/dL — ABNORMAL HIGH (ref 0.61–1.24)
GFR, Estimated: 46 mL/min — ABNORMAL LOW (ref >60)
Glucose, Bld: 141 mg/dL — ABNORMAL HIGH (ref 70–99)
Potassium: 3.3 mmol/L — ABNORMAL LOW (ref 3.5–5.1)
Sodium: 137 mmol/L (ref 135–145)

## 2021-12-17 LAB — SURGICAL PCR SCREEN
MRSA, PCR: NEGATIVE
Staphylococcus aureus: NEGATIVE

## 2021-12-17 NOTE — Progress Notes (Signed)
Anesthesia note:  Bowel prep reminder:NA  PCP - Dr. Fuller [place] Cardiologist -none Other-   Chest x-ray - no EKG - 12/17/21-chart Stress Test - no ECHO - no Cardiac Cath no   Pacemaker/ICD device last checked:no  Sleep Study - yes CPAP - yes  Pt is pre diabetic-no Fasting Blood Sugar -  Checks Blood Sugar _____  Blood Thinner:Eliquis/ Dr. Caryn Section Blood Thinner Instructions:hold 3 days prior to DOS/ Dr. Alvan Dame Aspirin Instructions: Last Dose:10/21  Anesthesia review: yes  Patient denies shortness of breath, fever, cough and chest pain at PAT appointment Pt reports no SOB with activities he moves slowly and has a flat affect but is oriented x4  Patient verbalized understanding of instructions that were given to them at the PAT appointment. Patient was also instructed that they will need to review over the PAT instructions again at home before surgery. Yes. Phone call and face to face

## 2021-12-20 NOTE — Progress Notes (Signed)
Anesthesia Chart Review   Case: [id number] Date/Time: 12/21/21 1115   Procedure: TOTAL HIP ARTHROPLASTY ANTERIOR APPROACH (Right: Hip)   Anesthesia type: Spinal   Pre-op diagnosis: Right hip osteoarthritis   Location: [facility] ROOM 10 / [facility]   Surgeons: Paralee Cancel, MD       DISCUSSION:71 y.o. former smoker with h/o HTN, sleep apnea, atrial fibrillation, renal insufficiency, right hip OA scheduled for above procedure 12/21/2021 with Dr. Paralee Cancel.   Pt advised to hold Eliquis three days per cardiology.  Cardiac clearance requested.   Creatinine appears to be stable when reviewing previous labs.   12/13/2021 Creatinine 2.0 11/29/2021 Creatinine 1.9 05/07/2021 Creatinine 1.2  Addendum: Pt tested positive for COVID, case rescheduled. Cardiac clearance requested for when he is reposted.  VS: There were no vitals taken for this visit.  PROVIDERS: Birdie Sons, MD is PCP    LABS: Labs reviewed: Acceptable for surgery. (all labs ordered are listed, but only abnormal results are displayed)  Labs Reviewed - No data to display   IMAGES:   EKG:   CV: Echo 10/25/2021 Interpretation Summary Compared to prior study, there is no significant change. This was essentially  a normal study.  Normal LV size. No LVH. LVEF>55% Normal RV size and function. Normal atrial sizes No significant valve disease. The aortic root is normal size. There is no pericardial effusion. The estimated RVSP= 84mHg Past Medical History:  Diagnosis Date   Anxiety    [place]rial [place]    Complication of anesthesia    long time to wake up post chole   GERD (gastroesophageal reflux disease)    Hypertension    Sleep apnea     Past Surgical History:  Procedure Laterality Date   CHOLECYSTECTOMY  01/2011    MEDICATIONS:  alfuzosin (UROXATRAL) 10 MG 24 hr tablet   apixaban (ELIQUIS) 5 MG TABS tablet   atorvastatin (LIPITOR) 80 MG tablet   clonazePAM (KLONOPIN) 1 MG tablet    Cyanocobalamin (B-12 PO)   dronedarone (MULTAQ) 400 MG tablet   indapamide (LOZOL) 2.5 MG tablet   magnesium oxide (MAG-OX) 400 (240 Mg) MG tablet   Multiple Vitamin (MULTIVITAMIN WITH MINERALS) TABS tablet   NON FORMULARY   omeprazole (PRILOSEC) 20 MG capsule   No current facility-administered medications for this encounter.    JKonrad FelixWard, PA-C [facility] Pre-Surgical Testing [phone]

## 2021-12-20 NOTE — H&P (Addendum)
TOTAL HIP ADMISSION H&P  Patient is admitted for right total hip arthroplasty.  Subjective:  Chief Complaint: right hip pain  HPI: Clinton Hurley, 71 y.o. male, has a history of pain and functional disability in the right hip(s) due to arthritis and patient has failed non-surgical conservative treatments for greater than 12 weeks to include NSAID's and/or analgesics, corticosteriod injections, and activity modification.  Onset of symptoms was gradual starting 2 years ago with gradually worsening course since that time.The patient noted no past surgery on the right hip(s).  Patient currently rates pain in the right hip at 8 out of 10 with activity. Patient has worsening of pain with activity and weight bearing, pain that interfers with activities of daily living, and pain with passive range of motion. Patient has evidence of joint space narrowing by imaging studies. This condition presents safety issues increasing the risk of falls. There is no current active infection.  Patient Active Problem List   Diagnosis Date Noted   Encounter for commercial driving license (CDL) exam 34/91/7915   Past Medical History:  Diagnosis Date   Anxiety    Atrial fibrillation ([place])    Complication of anesthesia    long time to wake up post chole   GERD (gastroesophageal reflux disease)    Hypertension    Sleep apnea     Past Surgical History:  Procedure Laterality Date   CHOLECYSTECTOMY  01/2011    No current facility-administered medications for this encounter.   Current Outpatient Medications  Medication Sig Dispense Refill Last Dose   alfuzosin (UROXATRAL) 10 MG 24 hr tablet Take 10 mg by mouth at bedtime.      apixaban (ELIQUIS) 5 MG TABS tablet Take 5 mg by mouth 2 (two) times daily.      atorvastatin (LIPITOR) 80 MG tablet Take 40 mg by mouth at bedtime.      clonazePAM (KLONOPIN) 1 MG tablet Take 0.5 mg by mouth 2 (two) times daily.      Cyanocobalamin (B-12 PO) Take 1 tablet by mouth daily.       dronedarone (MULTAQ) 400 MG tablet Take 400 mg by mouth 2 (two) times daily with a meal.      indapamide (LOZOL) 2.5 MG tablet Take 2.5 mg by mouth daily.      magnesium oxide (MAG-OX) 400 (240 Mg) MG tablet Take 400 mg by mouth daily.      Multiple Vitamin (MULTIVITAMIN WITH MINERALS) TABS tablet Take 1 tablet by mouth daily.      NON FORMULARY Pt uses a cpap nightly      omeprazole (PRILOSEC) 20 MG capsule Take 20 mg by mouth in the morning and at bedtime. Take 30 minutes before meal      No Known Allergies  Social History   Tobacco Use   Smoking status: Former   Smokeless tobacco: Never  Substance Use Topics   Alcohol use: Not Currently    Family History  Problem Relation Age of Onset   Lung cancer Mother      Review of Systems  Constitutional:  Negative for chills and fever.  Respiratory:  Negative for cough and shortness of breath.   Cardiovascular:  Negative for chest pain.  Gastrointestinal:  Negative for nausea and vomiting.  Musculoskeletal:  Positive for arthralgias.     Objective:  Physical Exam Well nourished and well developed. General: Alert and oriented x3, cooperative and pleasant, no acute distress. Head: normocephalic, atraumatic, neck supple. Eyes: EOMI.  Musculoskeletal: Right hip exam:  Very stiff right hip with hip flexion internal rotation just beyond neutral with external rotation of 20 degrees Active hip flexion with noted external rotation contracture Limited hip flexion external rotation and abduction  Calves soft and nontender. Motor function intact in LE. Strength 5/5 LE bilaterally. Neuro: Distal pulses 2+. Sensation to light touch intact in LE.  Vital signs in last 24 hours:    Labs:   Estimated body mass index is 35.09 kg/m as calculated from the following:   Height as of 12/17/21: '6\' 1"'$  (1.854 m).   Weight as of 12/17/21: 120.7 kg.   Imaging Review Plain radiographs demonstrate severe degenerative joint disease of the  right hip(s). The bone quality appears to be adequate for age and reported activity level.      Assessment/Plan:  End stage arthritis, right hip(s)  The patient history, physical examination, clinical judgement of the provider and imaging studies are consistent with end stage degenerative joint disease of the right hip(s) and total hip arthroplasty is deemed medically necessary. The treatment options including medical management, injection therapy, arthroscopy and arthroplasty were discussed at length. The risks and benefits of total hip arthroplasty were presented and reviewed. The risks due to aseptic loosening, infection, stiffness, dislocation/subluxation,  thromboembolic complications and other imponderables were discussed.  The patient acknowledged the explanation, agreed to proceed with the plan and consent was signed. Patient is being admitted for inpatient treatment for surgery, pain control, PT, OT, prophylactic antibiotics, VTE prophylaxis, progressive ambulation and ADL's and discharge planning.The patient is planning to be discharged  home.  Therapy Plans: HEP Disposition: Home with wife Planned DVT Prophylaxis: Eliquis DME needed: none PCP: Wyline Mood Cardiologist: Dr. Sharol Given, clearance received ( afib) Nephrologist: Appointment on Wednesday TXA: IV Allergies: NKDA Anesthesia Concerns: none BMI: 38.4 Last HgbA1c: Not diabetic   Other: - Hydrocodone, methocarbamol, tylenol - Cr. 1.9  Costella Hatcher, PA-C Orthopedic Surgery [facility] [place] [phone]

## 2021-12-21 ENCOUNTER — Ambulatory Visit: Payer: Self-pay | Admitting: *Deleted

## 2021-12-21 DIAGNOSIS — M1611 Unilateral primary osteoarthritis, right hip: Secondary | ICD-10-CM

## 2021-12-21 LAB — TYPE AND SCREEN
ABO/RH(D): O POS
Antibody Screen: NEGATIVE

## 2021-12-21 NOTE — Telephone Encounter (Signed)
  Chief Complaint: patient's wife called to reports positive covid at home test results and requesting medication .  Symptoms: nasal congestion heard, dizziness , tired, poor appetite. Able to drink fluids Frequency: 'Sunday 12/19/21 Pertinent Negatives: Patient denies chest pain no difficulty breathing, no fever per wife.  Disposition: []ED /[x]Urgent Care (no appt availability in office) / []Appointment(In office/virtual)/ [] [facility]/ []Home Care/ []Refused Recommended Disposition /[][facility]/ [] Follow-up with PCP Additional Notes:   Called patient back after talking with his wife to [facility] or [facility]. No My chart activated . Offered telehealth assistance. Wife recommended [facility] to order medication for patient but patient has not been seen since 2019.  Encouraged patient to seek assistance.      Reason for Disposition  [1] HIGH RISK patient (e.g., weak immune system, age > 64 years, obesity with BMI 30 or higher, pregnant, chronic lung disease or other chronic medical condition) AND [2] COVID symptoms (e.g., cough, fever)  (Exceptions: Already seen by PCP and no new or worsening symptoms.)  Answer Assessment - Initial Assessment Questions 1. COVID-19 DIAGNOSIS: "How do you know that you have COVID?" (e.g., positive lab test or self-test, diagnosed by doctor or NP/PA, symptoms after exposure).     Covid positive at home test 12/19/21 per patient wife  2. COVID-19 EXPOSURE: "Was there any known exposure to COVID before the symptoms began?" CDC Definition of close contact: within 6 feet (2 meters) for a total of 15 minutes or more over a 24-hour period.      Na  3. ONSET: "When did the COVID-19 symptoms start?"      10'$ /22/23 4. WORST SYMPTOM: "What is your worst symptom?" (e.g., cough, fever, shortness of breath, muscle aches)     Tired, dizziness, poor appetite nasal congestion 5. COUGH: "Do you have a cough?" If Yes, ask: "How bad is the  cough?"       na 6. FEVER: "Do you have a fever?" If Yes, ask: "What is your temperature, how was it measured, and when did it start?"     no 7. RESPIRATORY STATUS: "Describe your breathing?" (e.g., normal; shortness of breath, wheezing, unable to speak)      no 8. BETTER-SAME-WORSE: "Are you getting better, staying the same or getting worse compared to yesterday?"  If getting worse, ask, "In what way?"     na 9. OTHER SYMPTOMS: "Do you have any other symptoms?"  (e.g., chills, fatigue, headache, loss of smell or taste, muscle pain, sore throat)     Dizziness tired, poor appetite  10. HIGH RISK DISEASE: "Do you have any chronic medical problems?" (e.g., asthma, heart or lung disease, weak immune system, obesity, etc.)       Hx a fib, kidney issues  11. VACCINE: "Have you had the COVID-19 vaccine?" If Yes, ask: "Which one, how many shots, when did you get it?"       Yes no booster  12. PREGNANCY: "Is there any chance you are pregnant?" "When was your last menstrual period?"       na 13. O2 SATURATION MONITOR:  "Do you use an oxygen saturation monitor (pulse oximeter) at home?" If Yes, ask "What is your reading (oxygen level) today?" "What is your usual oxygen saturation reading?" (e.g., 95%)       na  Protocols used: Coronavirus (COVID-19) Diagnosed or Suspected-A-[facility]

## 2022-01-06 NOTE — Patient Instructions (Signed)
SURGICAL WAITING ROOM VISITATION Patients having surgery or a procedure may have no more than 2 support people in the waiting area - these visitors may rotate.   Children under the age of 62 must have an adult with them who is not the patient. If the patient needs to stay at the hospital during part of their recovery, the visitor guidelines for inpatient rooms apply. Pre-op nurse will coordinate an appropriate time for 1 support person to accompany patient in pre-op.  This support person may not rotate.    Please refer to the [facility] website for the visitor guidelines for Inpatients (after your surgery is over and you are in a regular room).                Your procedure is scheduled on: Tuesday 01-18-2022               Report to [facility] Main Entrance               Report to admitting at 9:00 AM               Call this number if you have problems the morning of surgery [phone]               Do not eat food :After Midnight.               After Midnight you may have the following liquids until 8:30 AM DAY OF SURGERY   Water Non-Citrus Juices (without pulp, NO RED) Carbonated Beverages Black Coffee (NO MILK/CREAM OR CREAMERS, sugar ok)  Clear Tea (NO MILK/CREAM OR CREAMERS, sugar ok) regular and decaf                             Plain Jell-O (NO RED)                                           Fruit ices (not with fruit pulp, NO RED)                                     Popsicles (NO RED)                                                               Sports drinks like Gatorade (NO RED)                      The day of surgery:  Drink ONE (1) Pre-Surgery Clear Ensure at 8:30 AM the morning of surgery. Drink in one sitting. Do not sip.  This drink was given to you during your hospital  pre-op appointment visit. Nothing else to drink after completing the  Pre-Surgery Clear Ensure.          If you have questions, please contact your surgeon's office. FOLLOW BOWEL  PREP AND ANY ADDITIONAL PRE OP INSTRUCTIONS YOU RECEIVED FROM YOUR SURGEON'S OFFICE!!!     Oral Hygiene is also important to reduce your risk of infection.  Remember - BRUSH YOUR TEETH THE MORNING OF SURGERY WITH YOUR REGULAR TOOTHPASTE               Take these medicines the morning of surgery with A SIP OF WATER: Clonazepam, Omeprazole , dronedarone (Multaq)    These are anesthesia recommendations for holding your anticoagulants.  Please contact your prescribing physician to confirm IF it is safe to hold your anticoagulants for this length of time.    Eliquis Apixaban   72 hours   (3 days)    Bring CPAP mask and tubing day of surgery.                              You may not have any metal on your body including jewelry, and body piercing              Do not wear lotions, powders, cologne, or deodorant               Men may shave face and neck.               Do not bring valuables to the hospital. [place].               Bring small overnight bag day of surgery.    DO NOT [place]. PHARMACY WILL DISPENSE MEDICATIONS LISTED ON YOUR MEDICATION LIST TO YOU DURING YOUR ADMISSION [place]!               Please read over the following fact sheets you were given: IF YOU HAVE QUESTIONS ABOUT YOUR PRE-OP INSTRUCTIONS PLEASE CALL [phone]-     I     [facility] - Preparing for Surgery Before surgery, you can play an important role.  Because skin is not sterile, your skin needs to be as free of germs as possible.  You can reduce the number of germs on your skin by washing with CHG (chlorahexidine gluconate) soap before surgery.  CHG is an antiseptic cleaner which kills germs and bonds with the skin to continue killing germs even after washing. Please DO NOT use if you have an allergy to CHG or antibacterial soaps.  If your skin becomes reddened/irritated stop  using the CHG and inform your nurse when you arrive at Short Stay. Do not shave (including legs and underarms) for at least 48 hours prior to the first CHG shower.  You may shave your face/neck.   Please follow these instructions carefully:             1.  Shower with CHG Soap the night before surgery and the  morning of surgery.             2.  If you choose to wash your hair, wash your hair first as usual with your normal  shampoo.             3.  After you shampoo, rinse your hair and body thoroughly to remove the shampoo.                                           4.  Use CHG as you would any other liquid soap.  You  can apply chg directly to the skin and wash.  Gently with a scrungie or clean washcloth.             5.  Apply the CHG Soap to your body ONLY FROM THE NECK DOWN.   Do                not use on face/ open                           Wound or open sores. Avoid contact with eyes, ears mouth and                        genitals (private parts).                       Wash face,  Genitals (private parts) with your normal soap.             6.  Wash thoroughly, paying special attention to the area where your                                     surgery  will be performed.             7.  Thoroughly rinse your body with warm water from the neck down.             8.  DO NOT shower/wash with your normal soap after using and rinsing off the CHG Soap.                9.  Pat yourself dry with a clean towel.            10.  Wear clean pajamas.            11.  Place clean sheets on your bed the night of your first shower and do not  sleep with pets. Day of Surgery : Do not apply any lotions/deodorants the morning of surgery.  Please wear clean clothes to the hospital/surgery center.   FAILURE TO FOLLOW THESE INSTRUCTIONS MAY RESULT IN THE CANCELLATION OF YOUR SURGERY   PATIENT SIGNATURE_________________________________   NURSE SIGNATURE__________________________________    ________________________________________________________________________        Adam Phenix   An incentive spirometer is a tool that can help keep your lungs clear and active. This tool measures how well you are filling your lungs with each breath. Taking long deep breaths may help reverse or decrease the chance of developing breathing (pulmonary) problems (especially infection) following: A long period of time when you are unable to move or be active. BEFORE THE PROCEDURE  If the spirometer includes an indicator to show your best effort, your nurse or respiratory therapist will set it to a desired goal. If possible, sit up straight or lean slightly forward. Try not to slouch. Hold the incentive spirometer in an upright position. INSTRUCTIONS FOR USE  Sit on the edge of your bed if possible, or sit up as far as you can in bed or on a chair. Hold the incentive spirometer in an upright position. Breathe out normally. Place the mouthpiece in your mouth and seal your lips tightly around it. Breathe in slowly and as deeply as possible, raising the piston or the ball toward the top of the column. Hold your breath for 3-5 seconds or for  as long as possible. Allow the piston or ball to fall to the bottom of the column. Remove the mouthpiece from your mouth and breathe out normally. Rest for a few seconds and repeat Steps 1 through 7 at least 10 times every 1-2 hours when you are awake. Take your time and take a few normal breaths between deep breaths. The spirometer may include an indicator to show your best effort. Use the indicator as a goal to work toward during each repetition. After each set of 10 deep breaths, practice coughing to be sure your lungs are clear. If you have an incision (the cut made at the time of surgery), support your incision when coughing by placing a pillow or rolled up towels firmly against it. Once you are able to get out of bed, walk around indoors and cough  well. You may stop using the incentive spirometer when instructed by your caregiver.  RISKS AND COMPLICATIONS Take your time so you do not get dizzy or light-headed. If you are in pain, you may need to take or ask for pain medication before doing incentive spirometry. It is harder to take a deep breath if you are having pain. AFTER USE Rest and breathe slowly and easily. It can be helpful to keep track of a log of your progress. Your caregiver can provide you with a simple table to help with this. If you are using the spirometer at home, follow these instructions: [place] IF:  You are having difficultly using the spirometer. You have trouble using the spirometer as often as instructed. Your pain medication is not giving enough relief while using the spirometer. You develop fever of 100.5 F (38.1 C) or higher. SEEK IMMEDIATE MEDICAL CARE IF:  You cough up bloody sputum that had not been present before. You develop fever of 102 F (38.9 C) or greater. You develop worsening pain at or near the incision site. MAKE SURE YOU:  Understand these instructions. Will watch your condition. Will get help right away if you are not doing well or get worse. Document Released: 06/27/2006 Document Revised: 05/09/2011 Document Reviewed: 08/28/2006 Exit[employer] Patient Information 2014 Exit[employer], [place].     ________________________________________________________________________       WHAT IS A BLOOD TRANSFUSION? Blood Transfusion Information   A transfusion is the replacement of blood or some of its parts. Blood is made up of multiple cells which provide different functions. Red blood cells carry oxygen and are used for blood loss replacement. White blood cells fight against infection. Platelets control bleeding. Plasma helps clot blood. Other blood products are available for specialized needs, such as hemophilia or other clotting disorders. BEFORE THE TRANSFUSION  Who gives blood for  transfusions?  Healthy volunteers who are fully evaluated to make sure their blood is safe. This is blood bank blood. Transfusion therapy is the safest it has ever been in the practice of medicine. Before blood is taken from a donor, a complete history is taken to make sure that person has no history of diseases nor engages in risky social behavior (examples are intravenous drug use or sexual activity with multiple partners). The donor's travel history is screened to minimize risk of transmitting infections, such as malaria. The donated blood is tested for signs of infectious diseases, such as HIV and hepatitis. The blood is then tested to be sure it is compatible with you in order to minimize the chance of a transfusion reaction. If you or a relative donates blood, this is often done  in anticipation of surgery and is not appropriate for emergency situations. It takes many days to process the donated blood. RISKS AND COMPLICATIONS Although transfusion therapy is very safe and saves many lives, the main dangers of transfusion include:  Getting an infectious disease. Developing a transfusion reaction. This is an allergic reaction to something in the blood you were given. Every precaution is taken to prevent this. The decision to have a blood transfusion has been considered carefully by your caregiver before blood is given. Blood is not given unless the benefits outweigh the risks. AFTER THE TRANSFUSION Right after receiving a blood transfusion, you will usually feel much better and more energetic. This is especially true if your red blood cells have gotten low (anemic). The transfusion raises the level of the red blood cells which carry oxygen, and this usually causes an energy increase. The nurse administering the transfusion will monitor you carefully for complications. HOME CARE INSTRUCTIONS  No special instructions are needed after a transfusion. You may find your energy is better. Speak with your  caregiver about any limitations on activity for underlying diseases you may have. SEEK MEDICAL CARE IF:  Your condition is not improving after your transfusion. You develop redness or irritation at the intravenous (IV) site. SEEK IMMEDIATE MEDICAL CARE IF:  Any of the following symptoms occur over the next 12 hours: Shaking chills. You have a temperature by mouth above 102 F (38.9 C), not controlled by medicine. Chest, back, or muscle pain. People around you feel you are not acting correctly or are confused. Shortness of breath or difficulty breathing. Dizziness and fainting. You get a rash or develop hives. You have a decrease in urine output. Your urine turns a dark color or changes to pink, red, or brown. Any of the following symptoms occur over the next 10 days: You have a temperature by mouth above 102 F (38.9 C), not controlled by medicine. Shortness of breath. Weakness after normal activity. The white part of the eye turns yellow (jaundice). You have a decrease in the amount of urine or are urinating less often. Your urine turns a dark color or changes to pink, red, or brown. Document Released: 02/12/2000 Document Revised: 05/09/2011 Document Reviewed: 10/01/2007 [facility] Patient Information 2014 [place].

## 2022-01-06 NOTE — Progress Notes (Addendum)
This surgery was reschedule from October 2023 d/t patient had COVID. All his labs will need to repeated because >30 days.  COVID Vaccine Completed: yes   Date of COVID positive in last 90 days: Yes   [facility] - [facility]  Dr. Wyline Mood, [facility],  Cardiologist - [place] Dr Harvel Ricks Urologist- [facility] Dr. Rulon Abide   Chest x-ray - n/a EKG - 01-05-2022  on chart Stress Test - 5 years ago per pt ECHO - 10-25-2021  [facility] Cardiac Cath - n/a Pacemaker/ICD device last checked: n/a Spinal Cord Stimulator: n/a   Sleep Study - yes,  CPAP - yes every night   Fasting Blood Sugar - n/a Checks Blood Sugar __0_ times a day   Blood Thinner Instructions: Eliquis, hold 3 days, Patient voiced understanding of this.  Aspirin Instructions:  none Last Dose:  Patient has a Foley Cath in place per [place] doctor. He says it will still be in place on the DOS. He has urine retention problems and bladder enlargement d/t this. No bladder infection per patient    Activity level: Can go up a flight of stairs and perform activities of daily living without stopping and without symptoms of chest pain or shortness of breath.                           Anesthesia review: a fib, HTN, OSA, CKD3   Patient denies shortness of breath, fever, cough and chest pain at PAT appointment   Patient verbalized understanding of instructions that were given to them at the PAT appointment. Patient was also instructed that they will need to review over the PAT instructions again at home before surgery.

## 2022-01-07 ENCOUNTER — Other Ambulatory Visit: Payer: Self-pay

## 2022-01-07 ENCOUNTER — Encounter (HOSPITAL_COMMUNITY)
Admission: RE | Admit: 2022-01-07 | Discharge: 2022-01-07 | Disposition: A | Discharge status: Home / Self Care | Payer: No Typology Code available for payment source | Source: Ambulatory Visit | Attending: Orthopedic Surgery | Admitting: Orthopedic Surgery

## 2022-01-07 ENCOUNTER — Encounter (HOSPITAL_COMMUNITY): Payer: Self-pay

## 2022-01-07 VITALS — BP 110/64 | HR 82 | Temp 98.7°F | Resp 20 | Ht 73.0 in | Wt 257.0 lb

## 2022-01-07 DIAGNOSIS — I10 Essential (primary) hypertension: Secondary | ICD-10-CM | POA: Insufficient documentation

## 2022-01-07 DIAGNOSIS — Z01818 Encounter for other preprocedural examination: Secondary | ICD-10-CM | POA: Insufficient documentation

## 2022-01-07 HISTORY — DX: Unspecified osteoarthritis, unspecified site: M19.90

## 2022-01-07 LAB — CBC
HCT: 39.1 % (ref 39.0–52.0)
Hemoglobin: 13.1 g/dL (ref 13.0–17.0)
MCH: 31.3 pg (ref 26.0–34.0)
MCHC: 33.5 g/dL (ref 30.0–36.0)
MCV: 93.5 fL (ref 80.0–100.0)
Platelets: 331 10*3/uL (ref 150–400)
RBC: 4.18 MIL/uL — ABNORMAL LOW (ref 4.22–5.81)
RDW: 12.4 % (ref 11.5–15.5)
WBC: 11.4 10*3/uL — ABNORMAL HIGH (ref 4.0–10.5)
nRBC: 0 % (ref 0.0–0.2)

## 2022-01-07 LAB — BASIC METABOLIC PANEL
Anion gap: 9 (ref 5–15)
BUN: 19 mg/dL (ref 8–23)
CO2: 27 mmol/L (ref 22–32)
Calcium: 9 mg/dL (ref 8.9–10.3)
Chloride: 102 mmol/L (ref 98–111)
Creatinine, Ser: 1.57 mg/dL — ABNORMAL HIGH (ref 0.61–1.24)
GFR, Estimated: 47 mL/min — ABNORMAL LOW (ref >60)
Glucose, Bld: 176 mg/dL — ABNORMAL HIGH (ref 70–99)
Potassium: 3.8 mmol/L (ref 3.5–5.1)
Sodium: 138 mmol/L (ref 135–145)

## 2022-01-07 LAB — SURGICAL PCR SCREEN
MRSA, PCR: NEGATIVE
Staphylococcus aureus: NEGATIVE

## 2022-01-08 LAB — TYPE AND SCREEN
ABO/RH(D): O POS
Antibody Screen: NEGATIVE

## 2022-01-17 ENCOUNTER — Encounter (HOSPITAL_COMMUNITY): Payer: Self-pay | Admitting: Orthopedic Surgery

## 2022-01-18 ENCOUNTER — Encounter (HOSPITAL_COMMUNITY)
Admission: RE | Disposition: A | Discharge status: Home / Self Care | Payer: Self-pay | Source: Home / Self Care | Attending: Orthopedic Surgery

## 2022-01-18 ENCOUNTER — Other Ambulatory Visit: Payer: Self-pay

## 2022-01-18 ENCOUNTER — Ambulatory Visit (HOSPITAL_COMMUNITY): Payer: No Typology Code available for payment source

## 2022-01-18 ENCOUNTER — Encounter (HOSPITAL_COMMUNITY): Payer: Self-pay | Admitting: Orthopedic Surgery

## 2022-01-18 ENCOUNTER — Inpatient Hospital Stay (HOSPITAL_COMMUNITY)
Admission: RE | Admit: 2022-01-18 | Discharge: 2022-01-25 | DRG: 470 | Disposition: A | Discharge status: Home / Self Care | Payer: No Typology Code available for payment source | Attending: Orthopedic Surgery | Admitting: Orthopedic Surgery

## 2022-01-18 ENCOUNTER — Ambulatory Visit (HOSPITAL_BASED_OUTPATIENT_CLINIC_OR_DEPARTMENT_OTHER): Payer: No Typology Code available for payment source | Admitting: Anesthesiology

## 2022-01-18 ENCOUNTER — Ambulatory Visit (HOSPITAL_COMMUNITY): Payer: No Typology Code available for payment source | Admitting: Physician Assistant

## 2022-01-18 DIAGNOSIS — Z7901 Long term (current) use of anticoagulants: Secondary | ICD-10-CM

## 2022-01-18 DIAGNOSIS — Z87891 Personal history of nicotine dependence: Secondary | ICD-10-CM

## 2022-01-18 DIAGNOSIS — I10 Essential (primary) hypertension: Secondary | ICD-10-CM | POA: Diagnosis present

## 2022-01-18 DIAGNOSIS — M1611 Unilateral primary osteoarthritis, right hip: Secondary | ICD-10-CM | POA: Diagnosis not present

## 2022-01-18 DIAGNOSIS — N400 Enlarged prostate without lower urinary tract symptoms: Secondary | ICD-10-CM | POA: Diagnosis present

## 2022-01-18 DIAGNOSIS — I4891 Unspecified atrial fibrillation: Secondary | ICD-10-CM | POA: Diagnosis not present

## 2022-01-18 DIAGNOSIS — F419 Anxiety disorder, unspecified: Secondary | ICD-10-CM | POA: Diagnosis present

## 2022-01-18 DIAGNOSIS — Z96641 Presence of right artificial hip joint: Secondary | ICD-10-CM

## 2022-01-18 DIAGNOSIS — I951 Orthostatic hypotension: Secondary | ICD-10-CM | POA: Diagnosis not present

## 2022-01-18 DIAGNOSIS — Z79899 Other long term (current) drug therapy: Secondary | ICD-10-CM

## 2022-01-18 DIAGNOSIS — Z9049 Acquired absence of other specified parts of digestive tract: Secondary | ICD-10-CM

## 2022-01-18 DIAGNOSIS — K219 Gastro-esophageal reflux disease without esophagitis: Secondary | ICD-10-CM | POA: Diagnosis present

## 2022-01-18 DIAGNOSIS — Z801 Family history of malignant neoplasm of trachea, bronchus and lung: Secondary | ICD-10-CM

## 2022-01-18 DIAGNOSIS — M25751 Osteophyte, right hip: Secondary | ICD-10-CM

## 2022-01-18 DIAGNOSIS — G4730 Sleep apnea, unspecified: Secondary | ICD-10-CM | POA: Diagnosis present

## 2022-01-18 DIAGNOSIS — N319 Neuromuscular dysfunction of bladder, unspecified: Secondary | ICD-10-CM | POA: Diagnosis present

## 2022-01-18 HISTORY — PX: TOTAL HIP ARTHROPLASTY: SHX124

## 2022-01-18 SURGERY — ARTHROPLASTY, HIP, TOTAL, ANTERIOR APPROACH
Anesthesia: General | Site: Hip | Laterality: Right

## 2022-01-18 MED ORDER — EPHEDRINE SULFATE-NACL 50-0.9 MG/10ML-% IV SOSY
PREFILLED_SYRINGE | INTRAVENOUS | Status: DC | PRN
  Administered 2022-01-18 (×3): 5 mg via INTRAVENOUS

## 2022-01-18 MED ORDER — METHOCARBAMOL 500 MG IVPB - SIMPLE MED: 500.0000 mg | Freq: Four times a day (QID) | INTRAVENOUS | Status: DC | PRN

## 2022-01-18 MED ORDER — PHENOL 1.4 % MT LIQD: 1.0000 | OROMUCOSAL | Status: DC | PRN

## 2022-01-18 MED ORDER — PROPOFOL 500 MG/50ML IV EMUL
INTRAVENOUS | Status: DC | PRN
  Administered 2022-01-18: 150 ug/kg/min via INTRAVENOUS

## 2022-01-18 MED ORDER — KETOROLAC TROMETHAMINE 30 MG/ML IJ SOLN
INTRAMUSCULAR | Status: DC | PRN
  Administered 2022-01-18: 30 mg

## 2022-01-18 MED ORDER — CEFAZOLIN SODIUM-DEXTROSE 2-4 GM/100ML-% IV SOLN
2.0000 g | Freq: Four times a day (QID) | INTRAVENOUS | Status: AC
  Administered 2022-01-18 (×2): 2 g via INTRAVENOUS
  Filled 2022-01-18 (×2): qty 100

## 2022-01-18 MED ORDER — DRONEDARONE HCL 400 MG PO TABS
400.0000 mg | ORAL_TABLET | Freq: Two times a day (BID) | ORAL | Status: DC
  Administered 2022-01-18 – 2022-01-25 (×14): 400 mg via ORAL
  Filled 2022-01-18 (×15): qty 1

## 2022-01-18 MED ORDER — LIDOCAINE HCL (PF) 2 % IJ SOLN
INTRAMUSCULAR | Status: AC
  Filled 2022-01-18: qty 5

## 2022-01-18 MED ORDER — SODIUM CHLORIDE 0.9 % IV SOLN: INTRAVENOUS | Status: DC

## 2022-01-18 MED ORDER — DEXAMETHASONE SODIUM PHOSPHATE 10 MG/ML IJ SOLN
INTRAMUSCULAR | Status: AC
  Filled 2022-01-18: qty 1

## 2022-01-18 MED ORDER — DEXAMETHASONE SODIUM PHOSPHATE 10 MG/ML IJ SOLN: 8.0000 mg | Freq: Once | INTRAMUSCULAR | Status: DC

## 2022-01-18 MED ORDER — LACTATED RINGERS IV SOLN: INTRAVENOUS | Status: DC

## 2022-01-18 MED ORDER — AMISULPRIDE (ANTIEMETIC) 5 MG/2ML IV SOLN: 10.0000 mg | Freq: Once | INTRAVENOUS | Status: DC | PRN

## 2022-01-18 MED ORDER — ROCURONIUM BROMIDE 10 MG/ML (PF) SYRINGE
PREFILLED_SYRINGE | INTRAVENOUS | Status: AC
  Filled 2022-01-18: qty 20

## 2022-01-18 MED ORDER — METOCLOPRAMIDE HCL 5 MG PO TABS: 5.0000 mg | ORAL_TABLET | Freq: Three times a day (TID) | ORAL | Status: DC | PRN

## 2022-01-18 MED ORDER — ORAL CARE MOUTH RINSE: 15.0000 mL | Freq: Once | OROMUCOSAL | Status: AC

## 2022-01-18 MED ORDER — APIXABAN 5 MG PO TABS
5.0000 mg | ORAL_TABLET | Freq: Two times a day (BID) | ORAL | Status: DC
  Administered 2022-01-19 – 2022-01-25 (×13): 5 mg via ORAL
  Filled 2022-01-18 (×13): qty 1

## 2022-01-18 MED ORDER — CEFAZOLIN SODIUM-DEXTROSE 2-4 GM/100ML-% IV SOLN
2.0000 g | INTRAVENOUS | Status: AC
  Administered 2022-01-18: 2 g via INTRAVENOUS
  Filled 2022-01-18: qty 100

## 2022-01-18 MED ORDER — ONDANSETRON HCL 4 MG/2ML IJ SOLN
4.0000 mg | Freq: Four times a day (QID) | INTRAMUSCULAR | Status: DC | PRN
  Administered 2022-01-18: 4 mg via INTRAVENOUS
  Filled 2022-01-18: qty 2

## 2022-01-18 MED ORDER — POLYETHYLENE GLYCOL 3350 17 G PO PACK
17.0000 g | PACK | Freq: Two times a day (BID) | ORAL | Status: DC
  Administered 2022-01-18 – 2022-01-24 (×11): 17 g via ORAL
  Filled 2022-01-18 (×14): qty 1

## 2022-01-18 MED ORDER — METHOCARBAMOL 500 MG PO TABS
500.0000 mg | ORAL_TABLET | Freq: Four times a day (QID) | ORAL | Status: DC | PRN
  Administered 2022-01-18 – 2022-01-19 (×2): 500 mg via ORAL
  Filled 2022-01-18 (×3): qty 1

## 2022-01-18 MED ORDER — FENTANYL CITRATE (PF) 100 MCG/2ML IJ SOLN
INTRAMUSCULAR | Status: AC
  Filled 2022-01-18: qty 2

## 2022-01-18 MED ORDER — FENTANYL CITRATE PF 50 MCG/ML IJ SOSY
25.0000 ug | PREFILLED_SYRINGE | INTRAMUSCULAR | Status: DC | PRN
  Administered 2022-01-18: 50 ug via INTRAVENOUS

## 2022-01-18 MED ORDER — ONDANSETRON HCL 4 MG/2ML IJ SOLN
INTRAMUSCULAR | Status: AC
  Filled 2022-01-18: qty 2

## 2022-01-18 MED ORDER — HYDROCODONE-ACETAMINOPHEN 5-325 MG PO TABS
ORAL_TABLET | ORAL | Status: AC
  Filled 2022-01-18: qty 2

## 2022-01-18 MED ORDER — KETOROLAC TROMETHAMINE 30 MG/ML IJ SOLN
INTRAMUSCULAR | Status: AC
  Filled 2022-01-18: qty 1

## 2022-01-18 MED ORDER — HYDROCODONE-ACETAMINOPHEN 5-325 MG PO TABS
1.0000 | ORAL_TABLET | ORAL | Status: DC | PRN
  Administered 2022-01-18 – 2022-01-20 (×4): 2 via ORAL
  Administered 2022-01-20 – 2022-01-25 (×9): 1 via ORAL
  Filled 2022-01-18: qty 2
  Filled 2022-01-18 (×6): qty 1
  Filled 2022-01-18: qty 2
  Filled 2022-01-18 (×2): qty 1
  Filled 2022-01-18: qty 2
  Filled 2022-01-18 (×3): qty 1
  Filled 2022-01-18: qty 2

## 2022-01-18 MED ORDER — MAGNESIUM OXIDE -MG SUPPLEMENT 400 (240 MG) MG PO TABS
400.0000 mg | ORAL_TABLET | Freq: Every day | ORAL | Status: DC
  Administered 2022-01-19 – 2022-01-25 (×7): 400 mg via ORAL
  Filled 2022-01-18 (×8): qty 1

## 2022-01-18 MED ORDER — DEXAMETHASONE SODIUM PHOSPHATE 10 MG/ML IJ SOLN
10.0000 mg | Freq: Once | INTRAMUSCULAR | Status: AC
  Administered 2022-01-19: 10 mg via INTRAVENOUS
  Filled 2022-01-18: qty 1

## 2022-01-18 MED ORDER — LIDOCAINE 2% (20 MG/ML) 5 ML SYRINGE
INTRAMUSCULAR | Status: DC | PRN
  Administered 2022-01-18: 80 mg via INTRAVENOUS

## 2022-01-18 MED ORDER — SODIUM CHLORIDE (PF) 0.9 % IJ SOLN
INTRAMUSCULAR | Status: DC | PRN
  Administered 2022-01-18: 30 mL

## 2022-01-18 MED ORDER — ACETAMINOPHEN 10 MG/ML IV SOLN
INTRAVENOUS | Status: DC | PRN
  Administered 2022-01-18: 1000 mg via INTRAVENOUS

## 2022-01-18 MED ORDER — ACETAMINOPHEN 10 MG/ML IV SOLN
INTRAVENOUS | Status: AC
  Filled 2022-01-18: qty 100

## 2022-01-18 MED ORDER — CHLORHEXIDINE GLUCONATE 0.12 % MT SOLN: 15.0000 mL | Freq: Once | OROMUCOSAL | Status: DC

## 2022-01-18 MED ORDER — ROCURONIUM BROMIDE 100 MG/10ML IV SOLN
INTRAVENOUS | Status: DC | PRN
  Administered 2022-01-18: 50 mg via INTRAVENOUS
  Administered 2022-01-18: 10 mg via INTRAVENOUS

## 2022-01-18 MED ORDER — HYDROCODONE-ACETAMINOPHEN 7.5-325 MG PO TABS
1.0000 | ORAL_TABLET | ORAL | Status: DC | PRN
  Administered 2022-01-19 – 2022-01-23 (×10): 1 via ORAL
  Filled 2022-01-18 (×10): qty 1

## 2022-01-18 MED ORDER — STERILE WATER FOR IRRIGATION IR SOLN
Status: DC | PRN
  Administered 2022-01-18: 2000 mL

## 2022-01-18 MED ORDER — POVIDONE-IODINE 10 % EX SWAB
2.0000 | Freq: Once | CUTANEOUS | Status: AC
  Administered 2022-01-18: 2 via TOPICAL

## 2022-01-18 MED ORDER — TRANEXAMIC ACID-NACL 1000-0.7 MG/100ML-% IV SOLN
1000.0000 mg | INTRAVENOUS | Status: AC
  Administered 2022-01-18: 1000 mg via INTRAVENOUS
  Filled 2022-01-18: qty 100

## 2022-01-18 MED ORDER — PROPOFOL 10 MG/ML IV BOLUS
INTRAVENOUS | Status: DC | PRN
  Administered 2022-01-18: 150 mg via INTRAVENOUS

## 2022-01-18 MED ORDER — 0.9 % SODIUM CHLORIDE (POUR BTL) OPTIME
TOPICAL | Status: DC | PRN
  Administered 2022-01-18: 1000 mL

## 2022-01-18 MED ORDER — DOCUSATE SODIUM 100 MG PO CAPS
100.0000 mg | ORAL_CAPSULE | Freq: Two times a day (BID) | ORAL | Status: DC
  Administered 2022-01-18 – 2022-01-24 (×13): 100 mg via ORAL
  Filled 2022-01-18 (×14): qty 1

## 2022-01-18 MED ORDER — CHLORHEXIDINE GLUCONATE 0.12 % MT SOLN
15.0000 mL | Freq: Once | OROMUCOSAL | Status: AC
  Administered 2022-01-18: 15 mL via OROMUCOSAL

## 2022-01-18 MED ORDER — TRANEXAMIC ACID-NACL 1000-0.7 MG/100ML-% IV SOLN
1000.0000 mg | Freq: Once | INTRAVENOUS | Status: AC
  Administered 2022-01-18: 1000 mg via INTRAVENOUS
  Filled 2022-01-18: qty 100

## 2022-01-18 MED ORDER — BUPIVACAINE-EPINEPHRINE (PF) 0.25% -1:200000 IJ SOLN
INTRAMUSCULAR | Status: DC | PRN
  Administered 2022-01-18: 30 mL

## 2022-01-18 MED ORDER — ATORVASTATIN CALCIUM 40 MG PO TABS
40.0000 mg | ORAL_TABLET | Freq: Every day | ORAL | Status: DC
  Administered 2022-01-18 – 2022-01-24 (×7): 40 mg via ORAL
  Filled 2022-01-18 (×7): qty 1

## 2022-01-18 MED ORDER — MORPHINE SULFATE (PF) 2 MG/ML IV SOLN
0.5000 mg | INTRAVENOUS | Status: DC | PRN
  Administered 2022-01-18 – 2022-01-19 (×3): 1 mg via INTRAVENOUS
  Filled 2022-01-18 (×4): qty 1

## 2022-01-18 MED ORDER — PROPOFOL 10 MG/ML IV BOLUS
INTRAVENOUS | Status: AC
  Filled 2022-01-18: qty 20

## 2022-01-18 MED ORDER — ACETAMINOPHEN 325 MG PO TABS: 325.0000 mg | ORAL_TABLET | Freq: Four times a day (QID) | ORAL | Status: DC | PRN

## 2022-01-18 MED ORDER — ORAL CARE MOUTH RINSE: 15.0000 mL | Freq: Once | OROMUCOSAL | Status: DC

## 2022-01-18 MED ORDER — DEXAMETHASONE SODIUM PHOSPHATE 10 MG/ML IJ SOLN
INTRAMUSCULAR | Status: DC | PRN
  Administered 2022-01-18: 5 mg via INTRAVENOUS

## 2022-01-18 MED ORDER — BUPIVACAINE-EPINEPHRINE (PF) 0.25% -1:200000 IJ SOLN
INTRAMUSCULAR | Status: AC
  Filled 2022-01-18: qty 30

## 2022-01-18 MED ORDER — BISACODYL 10 MG RE SUPP
10.0000 mg | Freq: Every day | RECTAL | Status: DC | PRN
  Administered 2022-01-20: 10 mg via RECTAL
  Filled 2022-01-18: qty 1

## 2022-01-18 MED ORDER — METOCLOPRAMIDE HCL 5 MG/ML IJ SOLN: 5.0000 mg | Freq: Three times a day (TID) | INTRAMUSCULAR | Status: DC | PRN

## 2022-01-18 MED ORDER — PANTOPRAZOLE SODIUM 40 MG PO TBEC
40.0000 mg | DELAYED_RELEASE_TABLET | Freq: Every day | ORAL | Status: DC
  Administered 2022-01-19 – 2022-01-25 (×7): 40 mg via ORAL
  Filled 2022-01-18 (×7): qty 1

## 2022-01-18 MED ORDER — MENTHOL 3 MG MT LOZG: 1.0000 | LOZENGE | OROMUCOSAL | Status: DC | PRN

## 2022-01-18 MED ORDER — SODIUM CHLORIDE (PF) 0.9 % IJ SOLN
INTRAMUSCULAR | Status: AC
  Filled 2022-01-18: qty 30

## 2022-01-18 MED ORDER — ALFUZOSIN HCL ER 10 MG PO TB24
10.0000 mg | ORAL_TABLET | Freq: Every day | ORAL | Status: DC
  Administered 2022-01-18 – 2022-01-23 (×6): 10 mg via ORAL
  Filled 2022-01-18 (×6): qty 1

## 2022-01-18 MED ORDER — EPHEDRINE 5 MG/ML INJ
INTRAVENOUS | Status: AC
  Filled 2022-01-18: qty 5

## 2022-01-18 MED ORDER — PROPOFOL 500 MG/50ML IV EMUL
INTRAVENOUS | Status: AC
  Filled 2022-01-18: qty 50

## 2022-01-18 MED ORDER — DIPHENHYDRAMINE HCL 12.5 MG/5ML PO ELIX: 12.5000 mg | ORAL_SOLUTION | ORAL | Status: DC | PRN

## 2022-01-18 MED ORDER — INDAPAMIDE 1.25 MG PO TABS
2.5000 mg | ORAL_TABLET | Freq: Every day | ORAL | Status: DC
  Administered 2022-01-19 – 2022-01-24 (×6): 2.5 mg via ORAL
  Filled 2022-01-18 (×7): qty 2

## 2022-01-18 MED ORDER — ONDANSETRON HCL 4 MG PO TABS: 4.0000 mg | ORAL_TABLET | Freq: Four times a day (QID) | ORAL | Status: DC | PRN

## 2022-01-18 MED ORDER — FENTANYL CITRATE PF 50 MCG/ML IJ SOSY
PREFILLED_SYRINGE | INTRAMUSCULAR | Status: AC
  Administered 2022-01-18: 50 ug via INTRAVENOUS
  Filled 2022-01-18: qty 2

## 2022-01-18 MED ORDER — FENTANYL CITRATE (PF) 100 MCG/2ML IJ SOLN
INTRAMUSCULAR | Status: DC | PRN
  Administered 2022-01-18 (×4): 50 ug via INTRAVENOUS

## 2022-01-18 MED ORDER — ONDANSETRON HCL 4 MG/2ML IJ SOLN
INTRAMUSCULAR | Status: DC | PRN
  Administered 2022-01-18: 4 mg via INTRAVENOUS

## 2022-01-18 SURGICAL SUPPLY — 39 items
BAG COUNTER SPONGE SURGICOUNT (BAG) IMPLANT
BAG DECANTER FOR FLEXI CONT (MISCELLANEOUS) IMPLANT
BAG ZIPLOCK 12X15 (MISCELLANEOUS) IMPLANT
BLADE SAG 18X100X1.27 (BLADE) ×1 IMPLANT
COVER PERINEAL POST (MISCELLANEOUS) ×1 IMPLANT
COVER SURGICAL LIGHT HANDLE (MISCELLANEOUS) ×1 IMPLANT
CUP ACET PINNCLE SECTR II 60MM (Hips) IMPLANT
DERMABOND ADVANCED .7 DNX12 (GAUZE/BANDAGES/DRESSINGS) ×1 IMPLANT
DRAPE FOOT SWITCH (DRAPES) ×1 IMPLANT
DRAPE STERI IOBAN 125X83 (DRAPES) ×1 IMPLANT
DRAPE U-SHAPE 47X51 STRL (DRAPES) ×2 IMPLANT
DRESSING AQUACEL AG SP 3.5X10 (GAUZE/BANDAGES/DRESSINGS) ×1 IMPLANT
DRSG AQUACEL AG ADV 3.5X10 (GAUZE/BANDAGES/DRESSINGS) IMPLANT
DRSG AQUACEL AG SP 3.5X10 (GAUZE/BANDAGES/DRESSINGS) ×1
DURAPREP 26ML APPLICATOR (WOUND CARE) ×1 IMPLANT
ELECT REM PT RETURN 15FT ADLT (MISCELLANEOUS) ×1 IMPLANT
GLOVE BIO SURGEON STRL SZ 6 (GLOVE) ×1 IMPLANT
GLOVE BIOGEL PI IND STRL 6.5 (GLOVE) ×1 IMPLANT
GLOVE BIOGEL PI IND STRL 7.5 (GLOVE) ×1 IMPLANT
GLOVE ORTHO TXT STRL SZ7.5 (GLOVE) ×2 IMPLANT
GOWN STRL REUS W/ TWL LRG LVL3 (GOWN DISPOSABLE) ×2 IMPLANT
GOWN STRL REUS W/TWL LRG LVL3 (GOWN DISPOSABLE) ×2
HEAD CERAMIC 36 PLUS5 (Hips) IMPLANT
HOLDER FOLEY CATH W/STRAP (MISCELLANEOUS) ×1 IMPLANT
KIT TURNOVER KIT A (KITS) IMPLANT
LINER NEUTRAL 58X36MM PLUS4 IMPLANT
PACK ANTERIOR HIP CUSTOM (KITS) ×1 IMPLANT
PINNACLE SECTOR II CUP 60MM (Hips) ×1 IMPLANT
SCREW 6.5MMX35MM (Screw) IMPLANT
STEM FEM ACTIS HIGH SZ8 (Stem) IMPLANT
SUT MNCRL AB 4-0 PS2 18 (SUTURE) ×1 IMPLANT
SUT STRATAFIX 0 PDS 27 VIOLET (SUTURE) ×1
SUT VIC AB 1 CT1 36 (SUTURE) ×3 IMPLANT
SUT VIC AB 2-0 CT1 27 (SUTURE) ×2
SUT VIC AB 2-0 CT1 TAPERPNT 27 (SUTURE) ×2 IMPLANT
SUTURE STRATFX 0 PDS 27 VIOLET (SUTURE) ×1 IMPLANT
TRAY FOLEY MTR SLVR 16FR STAT (SET/KITS/TRAYS/PACK) IMPLANT
TUBE SUCTION HIGH CAP CLEAR NV (SUCTIONS) ×1 IMPLANT
WATER STERILE IRR 1000ML POUR (IV SOLUTION) ×1 IMPLANT

## 2022-01-18 NOTE — Discharge Instructions (Signed)

## 2022-01-18 NOTE — Anesthesia Preprocedure Evaluation (Signed)
Anesthesia Evaluation  Patient identified by MRN, date of birth, ID band Patient awake    Reviewed: Allergy & Precautions, NPO status , Patient's Chart, lab work & pertinent test results  Airway Mallampati: I  TM Distance: <3 FB Neck ROM: Full    Dental  (+) Dental Advisory Given   Pulmonary sleep apnea , former smoker   breath sounds clear to auscultation       Cardiovascular hypertension, Pt. on medications + dysrhythmias Atrial Fibrillation  Rhythm:Regular Rate:Normal     Neuro/Psych negative neurological ROS     GI/Hepatic Neg liver ROS,GERD  ,,  Endo/Other  negative endocrine ROS    Renal/GU negative Renal ROS     Musculoskeletal  (+) Arthritis ,    Abdominal   Peds  Hematology negative hematology ROS (+)   Anesthesia Other Findings   Reproductive/Obstetrics                              Lab Results  Component Value Date   WBC 11.4 (H) 01/07/2022   HGB 13.1 01/07/2022   HCT 39.1 01/07/2022   MCV 93.5 01/07/2022   PLT 331 01/07/2022   Lab Results  Component Value Date   CREATININE 1.57 (H) 01/07/2022   BUN 19 01/07/2022   NA 138 01/07/2022   K 3.8 01/07/2022   CL 102 01/07/2022   CO2 27 01/07/2022    Anesthesia Physical Anesthesia Plan  ASA: 3  Anesthesia Plan: General   Post-op Pain Management: Ofirmev IV (intra-op)*   Induction: Intravenous  PONV Risk Score and Plan: 2 and Dexamethasone, Ondansetron and Treatment may vary due to age or medical condition  Airway Management Planned: Oral ETT  Additional Equipment: None  Intra-op Plan:   Post-operative Plan: Extubation in OR  Informed Consent: I have reviewed the patients History and Physical, chart, labs and discussed the procedure including the risks, benefits and alternatives for the proposed anesthesia with the patient or authorized representative who has indicated his/her understanding and acceptance.      Dental advisory given  Plan Discussed with: CRNA  Anesthesia Plan Comments:          Anesthesia Quick Evaluation

## 2022-01-18 NOTE — Interval H&P Note (Signed)
History and Physical Interval Note:  01/18/2022 9:28 AM  Clinton Hurley  has presented today for surgery, with the diagnosis of Right hip osteoarthritis.  The various methods of treatment have been discussed with the patient and family. After consideration of risks, benefits and other options for treatment, the patient has consented to  Procedure(s): TOTAL HIP ARTHROPLASTY ANTERIOR APPROACH (Right) as a surgical intervention.  The patient's history has been reviewed, patient examined, no change in status, stable for surgery.  I have reviewed the patient's chart and labs.  Questions were answered to the patient's satisfaction.     Mauri Pole

## 2022-01-18 NOTE — Op Note (Signed)
NAME:  Clinton Hurley                ACCOUNT NO.: [id number]      MEDICAL RECORD NO.: [id number]      FACILITY:  [facility]      PHYSICIAN:  Mauri Pole  DATE OF BIRTH:  December 01, 1950     DATE OF PROCEDURE:  01/18/2022                                 OPERATIVE REPORT         PREOPERATIVE DIAGNOSIS: Right  hip osteoarthritis.      POSTOPERATIVE DIAGNOSIS:  Right hip osteoarthritis.      PROCEDURE:  Right total hip replacement through an anterior approach   utilizing DePuy THR system, component size 60 mm pinnacle cup, a size 36+4 neutral   Altrex liner, a size 8 Hi Actis stem with a 36+5 delta ceramic   ball.      SURGEON:  Pietro Cassis. Alvan Dame, M.D.      ASSISTANT:  Costella Hatcher, PA-C     ANESTHESIA:  General.      SPECIMENS:  None.      COMPLICATIONS:  None.      BLOOD LOSS:  250 cc     DRAINS:  None.      INDICATION OF THE PROCEDURE:  Clinton Hurley is a 71 y.o. male who had   presented to office for evaluation of right hip pain.  Radiographs revealed   progressive degenerative changes with bone-on-bone   articulation of the  hip joint, including subchondral cystic changes and osteophytes.  The patient had painful limited range of   motion significantly affecting their overall quality of life and function.  The patient was failing to    respond to conservative measures including medications and/or injections and activity modification and at this point was ready   to proceed with more definitive measures.  Consent was obtained for   benefit of pain relief.  Specific risks of infection, DVT, component   failure, dislocation, neurovascular injury, and need for revision surgery were reviewed in the office.     PROCEDURE IN DETAIL:  The patient was brought to operative theater.   Once adequate anesthesia, preoperative antibiotics, 2 gm of Ancef, 1 gm of Tranexamic Acid, and 10 mg of Decadron were administered, the patient was positioned supine on the [employer] table.  Once the patient was safely positioned with adequate padding of boney prominences we predraped out the hip, and used fluoroscopy to confirm orientation of the pelvis.      The right hip was then prepped and draped from proximal iliac crest to   mid thigh with a shower curtain technique.      Time-out was performed identifying the patient, planned procedure, and the appropriate extremity.     An incision was then made 2 cm lateral to the   anterior superior iliac spine extending over the orientation of the   tensor fascia lata muscle and sharp dissection was carried down to the   fascia of the muscle.      The fascia was then incised.  The muscle belly was identified and swept   laterally and retractor placed along the superior neck.  Following   cauterization of the circumflex vessels and removing some pericapsular   fat, a second cobra retractor was placed on the inferior  neck.  A T-capsulotomy was made along the line of the   superior neck to the trochanteric fossa, then extended proximally and   distally.  Tag sutures were placed and the retractors were then placed   intracapsular.  We then identified the trochanteric fossa and   orientation of my neck cut and then made a neck osteotomy with the femur on traction.  The femoral   head was removed without difficulty or complication.  Traction was let   off and retractors were placed posterior and anterior around the   acetabulum.      The labrum and foveal tissue were debrided.  I began reaming with a 51 mm   reamer and reamed up to 59 mm reamer with good bony bed preparation and a 60 mm  cup was chosen.  The final 60 mm Pinnacle cup was then impacted under fluoroscopy to confirm the depth of penetration and orientation with respect to   Abduction and forward flexion.  A screw was placed into the ilium followed by the hole eliminator.  The final   36+4 neutral Altrex liner was impacted with good visualized rim fit.  The cup  was positioned anatomically within the acetabular portion of the pelvis.      At this point, the femur was rolled to 100 degrees.  Further capsule was   released off the inferior aspect of the femoral neck.  I then   released the superior capsule proximally.  With the leg in a neutral position the hook was placed laterally   along the femur under the vastus lateralis origin and elevated manually and then held in position using the hook attachment on the bed.  The leg was then extended and adducted with the leg rolled to 100   degrees of external rotation.  Retractors were placed along the medial calcar and posteriorly over the greater trochanter.  Once the proximal femur was fully   exposed, I used a box osteotome to set orientation.  I then began   broaching with the starting chili pepper broach and passed this by hand and then broached up to 8.  With the 8 broach in place I chose a high offset neck and did several trial reductions.  The offset was appropriate, leg lengths   appeared to be equal best matched with the +5 head ball trial confirmed radiographically.   Given these findings, I went ahead and dislocated the hip, repositioned all   retractors and positioned the right hip in the extended and abducted position.  The final 8 Hi Actis stem was   chosen and it was impacted down to the level of neck cut.  Based on this   and the trial reductions, a final 36+5 delta ceramic ball was chosen and   impacted onto a clean and dry trunnion, and the hip was reduced.  The   hip had been irrigated throughout the case again at this point.  I did   reapproximate the superior capsular leaflet to the anterior leaflet   using #1 Vicryl.  The fascia of the   tensor fascia lata muscle was then reapproximated using #1 Vicryl and #0 Stratafix sutures.  The   remaining wound was closed with 2-0 Vicryl and running 4-0 Monocryl.   The hip was cleaned, dried, and dressed sterilely using Dermabond and   Aquacel  dressing.  The patient was then brought   to recovery room in stable condition tolerating the procedure well.    Caryl Pina  Lu Duffel, PA-C was present for the entirety of the case involved from   preoperative positioning, perioperative retractor management, general   facilitation of the case, as well as primary wound closure as assistant.            Pietro Cassis Alvan Dame, M.D.        01/18/2022 9:30 AM

## 2022-01-18 NOTE — Anesthesia Postprocedure Evaluation (Signed)
Anesthesia Post Note  Patient: Clinton Hurley  Procedure(s) Performed: TOTAL HIP ARTHROPLASTY ANTERIOR APPROACH (Right: Hip)     Patient location during evaluation: PACU Anesthesia Type: General Level of consciousness: awake and alert Pain management: pain level controlled Vital Signs Assessment: post-procedure vital signs reviewed and stable Respiratory status: spontaneous breathing, nonlabored ventilation, respiratory function stable and patient connected to nasal cannula oxygen Cardiovascular status: blood pressure returned to baseline and stable Postop Assessment: no apparent nausea or vomiting Anesthetic complications: no  No notable events documented.  Last Vitals:  Vitals:   01/18/22 1500 01/18/22 1552  BP: (!) 102/53 102/63  Pulse: 69 71  Resp: 13   Temp:    SpO2: 100% 100%    Last Pain:  Vitals:   01/18/22 1500  TempSrc:   PainSc: 0-No pain                 Tiajuana Amass

## 2022-01-18 NOTE — Anesthesia Procedure Notes (Signed)
Procedure Name: Intubation Date/Time: 01/18/2022 11:43 AM  Performed by: Lavina Hamman, CRNAPre-anesthesia Checklist: Patient identified, Emergency Drugs available, Suction available, Patient being monitored and Timeout performed Patient Re-evaluated:Patient Re-evaluated prior to induction Oxygen Delivery Method: Circle system utilized Preoxygenation: Pre-oxygenation with 100% oxygen Induction Type: IV induction Ventilation: Mask ventilation without difficulty and Oral airway inserted - appropriate to patient size Laryngoscope Size: Mac and 4 Grade View: Grade I Tube type: Oral Tube size: 7.5 mm Number of attempts: 1 Airway Equipment and Method: Stylet Placement Confirmation: ETT inserted through vocal cords under direct vision, positive ETCO2, CO2 detector and breath sounds checked- equal and bilateral Secured at: 23 cm Tube secured with: Tape Dental Injury: Teeth and Oropharynx as per pre-operative assessment  Comments: ATOI

## 2022-01-18 NOTE — Plan of Care (Signed)
  Problem: Activity: Goal: Risk for activity intolerance will decrease Outcome: Progressing   Problem: Pain Managment: Goal: General experience of comfort will improve Outcome: Progressing   Problem: Safety: Goal: Ability to remain free from injury will improve Outcome: Progressing   

## 2022-01-18 NOTE — Evaluation (Signed)
Physical Therapy Evaluation Patient Details Name: Clinton Hurley MRN: [id number] DOB: 12/04/1950 Today's Date: 01/18/2022  History of Present Illness  Pt is a 71yo male presenting s/p R-THAAA on 01/18/22. PMH: afib, GERD, HTN, OSA,  Clinical Impression  Clinton Hurley is a 71 y.o. male POD 0 s/p R-THA, AA. Patient reports independence with mobility at baseline. Patient is now limited by functional impairments (see PT problem list below) and requires min assist for bed mobility, unable to dangle EOB secondary to pain despite three attempts with a variety of techniques (see below). Pt in 10/10 pain with grimacing and crying despite having received pain medications prior to session, RN notified. Patient instructed in exercise to facilitate ROM and circulation to manage edema. Provided incentive spirometer and with Vcs pt able to achieve 254m. Patient will benefit from continued skilled PT interventions to address impairments and progress towards PLOF. Acute PT will follow to progress mobility and stair training in preparation for safe discharge home.       Recommendations for follow up therapy are one component of a multi-disciplinary discharge planning process, led by the attending physician.  Recommendations may be updated based on patient status, additional functional criteria and insurance authorization.  Follow Up Recommendations Follow physician's recommendations for discharge plan and follow up therapies      Assistance Recommended at Discharge Frequent or constant Supervision/Assistance  Patient can return home with the following  A little help with walking and/or transfers;A little help with bathing/dressing/bathroom;Assistance with cooking/housework;Assist for transportation;Help with stairs or ramp for entrance    Equipment Recommendations Rolling walker (2 wheels)  Recommendations for Other Services       Functional Status Assessment Patient has had a recent decline in their  functional status and demonstrates the ability to make significant improvements in function in a reasonable and predictable amount of time.     Precautions / Restrictions Precautions Precautions: Fall Restrictions Weight Bearing Restrictions: No Other Position/Activity Restrictions: wbat      Mobility  Bed Mobility Overal bed mobility: Needs Assistance Bed Mobility: Supine to Sit     Supine to sit: Min assist, HOB elevated     General bed mobility comments: Pt attempted to bring RLE off bed utilizing only active movement of RLE, unable secondary to pain. Attempted to have pt utilizing gait belt to self-assist, unable. Attempted to provide assist directly and pt crying out and grimacing in pain, electing to defer further mobility.    Transfers                   General transfer comment: deferred    Ambulation/Gait               General Gait Details: deferred  Stairs            Wheelchair Mobility    Modified Rankin (Stroke Patients Only)       Balance                                             Pertinent Vitals/Pain Pain Assessment Pain Assessment: 0-10 Pain Score: 10-Worst pain ever Pain Location: right hip Pain Descriptors / Indicators: Operative site guarding Pain Intervention(s): Limited activity within patient's tolerance, Monitored during session, Repositioned    Home Living Family/patient expects to be discharged to:: Private residence Living Arrangements: Spouse/significant other Available Help at Discharge: Family;Available 24 hours/day  Type of Home: House Home Access: Stairs to enter Entrance Stairs-Rails: Left;Right;Can reach both Entrance Stairs-Number of Steps: 5 Alternate Level Stairs-Number of Steps: 13 Home Layout: Two level;Able to live on main level with bedroom/bathroom;1/2 bath on main level Home Equipment: Air cabin crew (4 wheels);Cane - single point      Prior Function Prior Level of  Function : Independent/Modified Independent;Driving;Working/employed             Mobility Comments: IND ADLs Comments: Donning and doffing socks on R foot     Hand Dominance        Extremity/Trunk Assessment   Upper Extremity Assessment Upper Extremity Assessment: Overall WFL for tasks assessed    Lower Extremity Assessment Lower Extremity Assessment: LLE deficits/detail;RLE deficits/detail RLE Deficits / Details: MMT ank DF/PF 5/5 RLE Sensation: WNL LLE Deficits / Details: MMT ank DF/PF 5/5 LLE Sensation: WNL    Cervical / Trunk Assessment Cervical / Trunk Assessment: Normal  Communication   Communication: No difficulties  Cognition Arousal/Alertness: Awake/alert Behavior During Therapy: WFL for tasks assessed/performed Overall Cognitive Status: Within Functional Limits for tasks assessed                                          General Comments General comments (skin integrity, edema, etc.): Wife and daughter preesent    Exercises Total Joint Exercises Ankle Circles/Pumps: AROM, Both, 10 reps   Assessment/Plan    PT Assessment Patient needs continued PT services  PT Problem List Decreased strength;Decreased range of motion;Decreased activity tolerance;Decreased balance;Decreased mobility;Decreased coordination;Pain       PT Treatment Interventions DME instruction;Patient/family education;Neuromuscular re-education;Balance training;Therapeutic exercise;Therapeutic activities;Functional mobility training;Stair training;Gait training    PT Goals (Current goals can be found in the Care Plan section)  Acute Rehab PT Goals Patient Stated Goal: Go back to work PT Goal Formulation: With patient Time For Goal Achievement: 01/25/22 Potential to Achieve Goals: Good    Frequency 7X/week     Co-evaluation               AM-PAC PT "6 Clicks" Mobility  Outcome Measure Help needed turning from your back to your side while in a flat bed  without using bedrails?: A Little Help needed moving from lying on your back to sitting on the side of a flat bed without using bedrails?: A Little Help needed moving to and from a bed to a chair (including a wheelchair)?: A Little Help needed standing up from a chair using your arms (e.g., wheelchair or bedside chair)?: A Little Help needed to walk in hospital room?: A Little Help needed climbing 3-5 steps with a railing? : A Lot 6 Click Score: 17    End of Session   Activity Tolerance: Patient limited by pain Patient left: in bed;with call bell/phone within reach;with bed alarm set;with family/visitor present;with SCD's reapplied Nurse Communication: Mobility status PT Visit Diagnosis: Pain;Difficulty in walking, not elsewhere classified (R26.2) Pain - Right/Left: Right Pain - part of body: Hip    Time: 0998-3382 PT Time Calculation (min) (ACUTE ONLY): 16 min   Charges:   PT Evaluation $PT Eval Low Complexity: [place], PT, DPT [place] Office: [phone] Weekend pager: [phone]  Coolidge Breeze 01/18/2022, 5:43 PM

## 2022-01-18 NOTE — Transfer of Care (Signed)
Immediate Anesthesia Transfer of Care Note  Patient: Clinton Hurley  Procedure(s) Performed: Procedure(s): TOTAL HIP ARTHROPLASTY ANTERIOR APPROACH (Right)  Patient Location: PACU  Anesthesia Type:General  Level of Consciousness:  sedated, patient cooperative and responds to stimulation  Airway & Oxygen Therapy:Patient Spontanous Breathing and Patient connected to face mask oxgen  Post-op Assessment:  Report given to PACU RN and Post -op Vital signs reviewed and stable  Post vital signs:  Reviewed and stable  Last Vitals:  Vitals:   01/18/22 0840  BP: (!) 142/74  Pulse: 70  Resp: 18  Temp: 36.7 C  SpO2: 39%    Complications: No apparent anesthesia complications

## 2022-01-19 LAB — BASIC METABOLIC PANEL
Anion gap: 8 (ref 5–15)
BUN: 21 mg/dL (ref 8–23)
CO2: 25 mmol/L (ref 22–32)
Calcium: 8.4 mg/dL — ABNORMAL LOW (ref 8.9–10.3)
Chloride: 107 mmol/L (ref 98–111)
Creatinine, Ser: 1.39 mg/dL — ABNORMAL HIGH (ref 0.61–1.24)
GFR, Estimated: 55 mL/min — ABNORMAL LOW (ref >60)
Glucose, Bld: 138 mg/dL — ABNORMAL HIGH (ref 70–99)
Potassium: 4.1 mmol/L (ref 3.5–5.1)
Sodium: 140 mmol/L (ref 135–145)

## 2022-01-19 LAB — CBC
HCT: 32.5 % — ABNORMAL LOW (ref 39.0–52.0)
Hemoglobin: 10.5 g/dL — ABNORMAL LOW (ref 13.0–17.0)
MCH: 31.5 pg (ref 26.0–34.0)
MCHC: 32.3 g/dL (ref 30.0–36.0)
MCV: 97.6 fL (ref 80.0–100.0)
Platelets: 188 10*3/uL (ref 150–400)
RBC: 3.33 MIL/uL — ABNORMAL LOW (ref 4.22–5.81)
RDW: 13.2 % (ref 11.5–15.5)
WBC: 16.1 10*3/uL — ABNORMAL HIGH (ref 4.0–10.5)
nRBC: 0 % (ref 0.0–0.2)

## 2022-01-19 MED ORDER — TIZANIDINE HCL 4 MG PO TABS
4.0000 mg | ORAL_TABLET | Freq: Three times a day (TID) | ORAL | Status: DC | PRN
  Administered 2022-01-19 – 2022-01-23 (×3): 4 mg via ORAL
  Filled 2022-01-19 (×4): qty 1

## 2022-01-19 MED ORDER — CLONAZEPAM 0.125 MG PO TBDP
0.5000 mg | ORAL_TABLET | Freq: Two times a day (BID) | ORAL | Status: DC | PRN
  Administered 2022-01-19 – 2022-01-24 (×5): 0.5 mg via ORAL
  Filled 2022-01-19 (×7): qty 4

## 2022-01-19 NOTE — Progress Notes (Signed)
Physical Therapy Treatment Patient Details Name: Clinton Hurley MRN: [id number] DOB: August 25, 1950 Today's Date: 01/19/2022   History of Present Illness Pt is a 71yo male presenting s/p R-THAAA on 01/18/22. PMH: afib, GERD, HTN, OSA,    PT Comments    Pt assisted with sitting EOB and standing, transfer to recliner.  Pt "apprehensive" of pain and required increased time.  Pt dizzy with sitting (which did resolve) and then again with transfer, so obtained BP, 125/61 mmHg, HR 68 bpm upon sitting in recliner.  Pt encouraged to remain OOB and perform ankle pumps.     Recommendations for follow up therapy are one component of a multi-disciplinary discharge planning process, led by the attending physician.  Recommendations may be updated based on patient status, additional functional criteria and insurance authorization.  Follow Up Recommendations  Follow physician's recommendations for discharge plan and follow up therapies     Assistance Recommended at Discharge Frequent or constant Supervision/Assistance  Patient can return home with the following A little help with walking and/or transfers;A little help with bathing/dressing/bathroom;Assistance with cooking/housework;Assist for transportation;Help with stairs or ramp for entrance   Equipment Recommendations  Rolling walker (2 wheels)    Recommendations for Other Services       Precautions / Restrictions Precautions Precautions: Fall Restrictions Weight Bearing Restrictions: No Other Position/Activity Restrictions: WBAT     Mobility  Bed Mobility Overal bed mobility: Needs Assistance Bed Mobility: Supine to Sit     Supine to sit: Mod assist, HOB elevated     General bed mobility comments: assist for R LE and trunk upright, increased time and effort as pt "apprehensive" of pain    Transfers Overall transfer level: Needs assistance Equipment used: Rolling walker (2 wheels) Transfers: Sit to/from Stand, Bed to  chair/wheelchair/BSC Sit to Stand: Min assist, From elevated surface   Step pivot transfers: Min assist       General transfer comment: verbal cues for UE and LE positioning, assist to rise; cues for small step over to recliner, increased reliance on RW for support    Ambulation/Gait                   Stairs             Wheelchair Mobility    Modified Rankin (Stroke Patients Only)       Balance                                            Cognition Arousal/Alertness: Awake/alert Behavior During Therapy: WFL for tasks assessed/performed Overall Cognitive Status: Within Functional Limits for tasks assessed                                          Exercises      General Comments        Pertinent Vitals/Pain Pain Assessment Pain Assessment: 0-10 Pain Score: 8  Pain Location: right hip Pain Descriptors / Indicators: Aching, Sore Pain Intervention(s): Monitored during session, Repositioned, Premedicated before session (mostly with movement, eases with rest)    Home Living                          Prior Function  PT Goals (current goals can now be found in the care plan section) Progress towards PT goals: Progressing toward goals    Frequency    7X/week      PT Plan Current plan remains appropriate    Co-evaluation              AM-PAC PT "6 Clicks" Mobility   Outcome Measure  Help needed turning from your back to your side while in a flat bed without using bedrails?: A Little Help needed moving from lying on your back to sitting on the side of a flat bed without using bedrails?: A Little Help needed moving to and from a bed to a chair (including a wheelchair)?: A Little Help needed standing up from a chair using your arms (e.g., wheelchair or bedside chair)?: A Little Help needed to walk in hospital room?: A Little Help needed climbing 3-5 steps with a railing? : A Lot 6  Click Score: 17    End of Session Equipment Utilized During Treatment: Gait belt Activity Tolerance: Patient limited by pain Patient left: in chair;with call bell/phone within reach;with family/visitor present Nurse Communication: Mobility status PT Visit Diagnosis: Pain;Difficulty in walking, not elsewhere classified (R26.2) Pain - Right/Left: Right     Time: 4034-7425 PT Time Calculation (min) (ACUTE ONLY): 28 min  Charges:  $Therapeutic Activity: 23-37 mins                    Jannette Spanner PT, DPT Physical Therapist Acute Rehabilitation Services Preferred contact method: Secure Chat Weekend Pager Only: [phone] Office: [phone]    Myrtis Hopping [name] 01/19/2022, 3:58 PM

## 2022-01-19 NOTE — Progress Notes (Signed)
   Subjective: 1 Day Post-Op Procedure(s) (LRB): TOTAL HIP ARTHROPLASTY ANTERIOR APPROACH (Right) Patient reports pain as severe.   Patient seen in rounds with Dr. Alvan Dame. Patient is resting in bed with family at the bedside this morning. No acute events overnight. He was not able to tolerate much PT yesterday.  We will start therapy today.   Objective: Vital signs in last 24 hours: Temp:  [98 F (36.7 C)-99 F (37.2 C)] 98.6 F (37 C) (11/22 0510) Pulse Rate:  [63-86] 75 (11/22 0510) Resp:  [11-18] 18 (11/22 0510) BP: (91-142)/(48-74) 105/57 (11/22 0510) SpO2:  [93 %-100 %] 97 % (11/22 0510) Weight:  [433 kg] 116 kg (11/21 0851)  Intake/Output from previous day:  Intake/Output Summary (Last 24 hours) at 01/19/2022 0835 Last data filed at 01/19/2022 0500 Gross per 24 hour  Intake 2317.2 ml  Output 3350 ml  Net -1032.8 ml     Intake/Output this shift: No intake/output data recorded.  Labs: Recent Labs    01/19/22 0338  HGB 10.5*   Recent Labs    01/19/22 0338  WBC 16.1*  RBC 3.33*  HCT 32.5*  PLT 188   Recent Labs    01/19/22 0338  NA 140  K 4.1  CL 107  CO2 25  BUN 21  CREATININE 1.39*  GLUCOSE 138*  CALCIUM 8.4*   No results for input(s): "LABPT", "INR" in the last 72 hours.  Exam: General - Patient is Alert and Oriented Extremity - Neurologically intact Sensation intact distally Intact pulses distally Dorsiflexion/Plantar flexion intact Dressing - dressing C/D/I Motor Function - intact, moving foot and toes well on exam.   Past Medical History:  Diagnosis Date   Anxiety    Arthritis    Atrial fibrillation (HCC)    Complication of anesthesia    long time to wake up post chole   GERD (gastroesophageal reflux disease)    Hypertension    Sleep apnea     Assessment/Plan: 1 Day Post-Op Procedure(s) (LRB): TOTAL HIP ARTHROPLASTY ANTERIOR APPROACH (Right) Principal Problem:   S/P total right hip arthroplasty  Estimated body mass index  is 33.74 kg/m as calculated from the following:   Height as of this encounter: '6\' 1"'$  (1.854 m).   Weight as of this encounter: 116 kg. Advance diet Up with therapy D/C IV fluids  DVT Prophylaxis -  Eliquis Weight bearing as tolerated.  Hgb stable at 10.5 this AM.  Will change muscle relaxant. Can also change pain med if needed.   Plan is to go Home after hospital stay. Plan for discharge today if he is meeting goals with therapy and pain is controlled. Otherwise discharge tomorrow. Follow up in the office in 2 weeks.   Griffith Citron, PA-C Orthopedic Surgery [phone] 01/19/2022, 8:35 AM

## 2022-01-19 NOTE — Progress Notes (Signed)
Physical Therapy Treatment Patient Details Name: Clinton Hurley MRN: [id number] DOB: 1950-12-12 Today's Date: 01/19/2022   History of Present Illness Pt is a 71yo male presenting s/p R-THAA on 01/18/22. PMH: afib, GERD, HTN, OSA,    PT Comments    Pt assisted with ambulating however only able to tolerate short distance (remained in room).  Pt assisted back to bed per request.  Pt has steps to enter home and not ready to perform those yet.  Pt encouraged to perform heel slides with gait belt and ankle pumps as tolerated in bed (aware not to perform SLR at this time).   Recommendations for follow up therapy are one component of a multi-disciplinary discharge planning process, led by the attending physician.  Recommendations may be updated based on patient status, additional functional criteria and insurance authorization.  Follow Up Recommendations  Follow physician's recommendations for discharge plan and follow up therapies     Assistance Recommended at Discharge Frequent or constant Supervision/Assistance  Patient can return home with the following A little help with walking and/or transfers;A little help with bathing/dressing/bathroom;Assistance with cooking/housework;Assist for transportation;Help with stairs or ramp for entrance   Equipment Recommendations  Rolling walker (2 wheels)    Recommendations for Other Services       Precautions / Restrictions Precautions Precautions: Fall Restrictions Weight Bearing Restrictions: No Other Position/Activity Restrictions: WBAT     Mobility  Bed Mobility Overal bed mobility: Needs Assistance Bed Mobility: Sit to Supine     Supine to sit: Mod assist, HOB elevated Sit to supine: Min guard   General bed mobility comments: verbal cues for technique, utilized gait belt for pt to self assist LE onto bed    Transfers Overall transfer level: Needs assistance Equipment used: Rolling walker (2 wheels) Transfers: Sit to/from  Stand Sit to Stand: Min guard   Step pivot transfers: Min assist       General transfer comment: verbal cues for UE and LE positioning, increased time and effort however pt able to perform min/guard this afternoon    Ambulation/Gait Ambulation/Gait assistance: Min guard, Min assist Gait Distance (Feet): 18 Feet Assistive device: Rolling walker (2 wheels) Gait Pattern/deviations: Step-to pattern, Decreased stance time - right, Antalgic       General Gait Details: verbal cues for sequence, RW positioning, step length, posture; pt only able to tolerate ambulating short distance around room then requested back to bed   Stairs             Wheelchair Mobility    Modified Rankin (Stroke Patients Only)       Balance                                            Cognition Arousal/Alertness: Awake/alert Behavior During Therapy: WFL for tasks assessed/performed Overall Cognitive Status: Within Functional Limits for tasks assessed                                          Exercises      General Comments        Pertinent Vitals/Pain Pain Assessment Pain Assessment: 0-10 Pain Score: 6  Pain Location: right hip Pain Descriptors / Indicators: Aching, Sore Pain Intervention(s): Repositioned, Monitored during session    Home Living  Prior Function            PT Goals (current goals can now be found in the care plan section) Progress towards PT goals: Progressing toward goals    Frequency    7X/week      PT Plan Current plan remains appropriate    Co-evaluation              AM-PAC PT "6 Clicks" Mobility   Outcome Measure  Help needed turning from your back to your side while in a flat bed without using bedrails?: A Little Help needed moving from lying on your back to sitting on the side of a flat bed without using bedrails?: A Little Help needed moving to and from a bed to a  chair (including a wheelchair)?: A Little Help needed standing up from a chair using your arms (e.g., wheelchair or bedside chair)?: A Little Help needed to walk in hospital room?: A Little Help needed climbing 3-5 steps with a railing? : A Lot 6 Click Score: 17    End of Session Equipment Utilized During Treatment: Gait belt Activity Tolerance: Patient limited by pain Patient left: in bed;with call bell/phone within reach;with bed alarm set;with family/visitor present Nurse Communication: Mobility status PT Visit Diagnosis: Pain;Difficulty in walking, not elsewhere classified (R26.2) Pain - Right/Left: Right Pain - part of body: Hip     Time: 2836-6294 PT Time Calculation (min) (ACUTE ONLY): 21 min  Charges:  $Gait Training: 8-22 mins                     Arlyce Dice, DPT Physical Therapist Acute Rehabilitation Services Preferred contact method: Secure Chat Weekend Pager Only: [phone] Office: [phone]  Myrtis Hopping [name] 01/19/2022, 4:08 PM

## 2022-01-19 NOTE — TOC Progression Note (Signed)
Transition of Care [facility]) - Progression Note    Patient Details  Name: Clinton Hurley MRN: [id number] Date of Birth: 01-27-1951  [facility]) CM/SW Contact  Servando Snare, [place] Phone Number: 01/19/2022, 11:42 AM  Clinical Narrative:    TOC following for DME needs. [place] [phone] at ext [phone] and ext [phone] to determine DME agency. Unable to leave message.        Expected Discharge Plan and Services                                                 Social Determinants of Health (SDOH) Interventions Food Insecurity Interventions: Intervention Not Indicated Housing Interventions: Intervention Not Indicated Transportation Interventions: Intervention Not Indicated Utilities Interventions: Intervention Not Indicated  Readmission Risk Interventions     No data to display

## 2022-01-19 NOTE — Plan of Care (Signed)
  Problem: Activity: Goal: Risk for activity intolerance will decrease Outcome: Progressing   Problem: Pain Managment: Goal: General experience of comfort will improve Outcome: Progressing   Problem: Safety: Goal: Ability to remain free from injury will improve Outcome: Progressing   

## 2022-01-20 LAB — CBC
HCT: 30.4 % — ABNORMAL LOW (ref 39.0–52.0)
Hemoglobin: 10.2 g/dL — ABNORMAL LOW (ref 13.0–17.0)
MCH: 31.9 pg (ref 26.0–34.0)
MCHC: 33.6 g/dL (ref 30.0–36.0)
MCV: 95 fL (ref 80.0–100.0)
Platelets: 170 10*3/uL (ref 150–400)
RBC: 3.2 MIL/uL — ABNORMAL LOW (ref 4.22–5.81)
RDW: 13.1 % (ref 11.5–15.5)
WBC: 15.1 10*3/uL — ABNORMAL HIGH (ref 4.0–10.5)
nRBC: 0 % (ref 0.0–0.2)

## 2022-01-20 MED ORDER — TIZANIDINE HCL 4 MG PO TABS: 4.0000 mg | ORAL_TABLET | Freq: Three times a day (TID) | ORAL | 2 refills | Status: AC | PRN

## 2022-01-20 MED ORDER — DOCUSATE SODIUM 100 MG PO CAPS: 100.0000 mg | ORAL_CAPSULE | Freq: Two times a day (BID) | ORAL | 0 refills | Status: AC

## 2022-01-20 MED ORDER — POLYETHYLENE GLYCOL 3350 17 G PO PACK: 17.0000 g | PACK | Freq: Two times a day (BID) | ORAL | 0 refills | Status: AC

## 2022-01-20 MED ORDER — SODIUM CHLORIDE 0.9 % IV BOLUS
250.0000 mL | Freq: Once | INTRAVENOUS | Status: AC
  Administered 2022-01-20: 250 mL via INTRAVENOUS

## 2022-01-20 MED ORDER — HYDROCODONE-ACETAMINOPHEN 7.5-325 MG PO TABS: 1.0000 | ORAL_TABLET | ORAL | 0 refills | Status: AC | PRN

## 2022-01-20 MED ORDER — CHLORHEXIDINE GLUCONATE CLOTH 2 % EX PADS
6.0000 | MEDICATED_PAD | Freq: Every day | CUTANEOUS | Status: DC
  Administered 2022-01-20 – 2022-01-25 (×6): 6 via TOPICAL

## 2022-01-20 NOTE — Progress Notes (Signed)
Physical Therapy Treatment Patient Details Name: Clinton Hurley MRN: [id number] DOB: 1950/07/06 Today's Date: 01/20/2022   History of Present Illness Pt is a 71yo male presenting s/p R-THAA on 01/18/22. PMH: afib, GERD, HTN, OSA,    PT Comments    Pt continues very cooperative but limited this am by orthostatic hypotension.  Pt performed HEP with assist  and assisted from bed but ambulation distance ltd by increasing dizziness. BP sitting 118/54; standing 100/56; standing 3 min 100/60; after ambulating 91/53.  RN aware.  Recommendations for follow up therapy are one component of a multi-disciplinary discharge planning process, led by the attending physician.  Recommendations may be updated based on patient status, additional functional criteria and insurance authorization.  Follow Up Recommendations  Follow physician's recommendations for discharge plan and follow up therapies     Assistance Recommended at Discharge Frequent or constant Supervision/Assistance  Patient can return home with the following A little help with walking and/or transfers;A little help with bathing/dressing/bathroom;Assistance with cooking/housework;Assist for transportation;Help with stairs or ramp for entrance   Equipment Recommendations  Rolling walker (2 wheels)    Recommendations for Other Services       Precautions / Restrictions Precautions Precautions: Fall Restrictions Weight Bearing Restrictions: No RLE Weight Bearing: Weight bearing as tolerated Other Position/Activity Restrictions: WBAT     Mobility  Bed Mobility Overal bed mobility: Needs Assistance Bed Mobility: Supine to Sit     Supine to sit: Min assist     General bed mobility comments: cues for sequence and use of L LE to self assist    Transfers Overall transfer level: Needs assistance Equipment used: Rolling walker (2 wheels) Transfers: Sit to/from Stand Sit to Stand: Min guard           General transfer comment:  verbal cues for UE and LE positioning, increased time and effort however pt able to perform min/guard this afternoon    Ambulation/Gait Ambulation/Gait assistance: Min assist Gait Distance (Feet): 17 Feet Assistive device: Rolling walker (2 wheels) Gait Pattern/deviations: Step-to pattern, Decreased stance time - right, Antalgic Gait velocity: decr     General Gait Details: verbal cues for sequence, RW positioning, step length, posture; distance ltd by c/p increased dizziness   Stairs             Wheelchair Mobility    Modified Rankin (Stroke Patients Only)       Balance                                            Cognition Arousal/Alertness: Awake/alert Behavior During Therapy: WFL for tasks assessed/performed Overall Cognitive Status: Within Functional Limits for tasks assessed                                          Exercises Total Joint Exercises Ankle Circles/Pumps: AROM, Both, 15 reps, Supine Quad Sets: AROM, 10 reps, Supine, Both Heel Slides: AAROM, Right, 20 reps, Supine Hip ABduction/ADduction: AAROM, Right, 15 reps, Supine    General Comments General comments (skin integrity, edema, etc.): dtr present      Pertinent Vitals/Pain Pain Assessment Pain Assessment: 0-10 Pain Score: 5  Pain Location: right hip Pain Descriptors / Indicators: Aching, Sore Pain Intervention(s): Limited activity within patient's tolerance, Monitored during session, Premedicated before session,  Ice applied    Home Living                          Prior Function            PT Goals (current goals can now be found in the care plan section) Acute Rehab PT Goals Patient Stated Goal: Go back to work PT Goal Formulation: With patient Time For Goal Achievement: 01/25/22 Potential to Achieve Goals: Good Progress towards PT goals: Progressing toward goals    Frequency    7X/week      PT Plan Current plan remains  appropriate    Co-evaluation              AM-PAC PT "6 Clicks" Mobility   Outcome Measure  Help needed turning from your back to your side while in a flat bed without using bedrails?: A Little Help needed moving from lying on your back to sitting on the side of a flat bed without using bedrails?: A Little Help needed moving to and from a bed to a chair (including a wheelchair)?: A Little Help needed standing up from a chair using your arms (e.g., wheelchair or bedside chair)?: A Little Help needed to walk in hospital room?: A Little Help needed climbing 3-5 steps with a railing? : A Lot 6 Click Score: 17    End of Session Equipment Utilized During Treatment: Gait belt Activity Tolerance: Patient tolerated treatment well Patient left: in chair;with call bell/phone within reach;with family/visitor present Nurse Communication: Mobility status PT Visit Diagnosis: Pain;Difficulty in walking, not elsewhere classified (R26.2) Pain - Right/Left: Right Pain - part of body: Hip     Time: 9784-7841 PT Time Calculation (min) (ACUTE ONLY): 28 min  Charges:  $Gait Training: 8-22 mins $Therapeutic Exercise: 8-22 mins                     Debe Coder PT Acute Rehabilitation Services Pager [phone] Office [phone]    Clinton Hurley 01/20/2022, 11:11 AM

## 2022-01-20 NOTE — Progress Notes (Signed)
Pt refused CPAP qhs.  Pt states that our machine dries his mouth and throat out too much for him to tolerate it and would rather sleep without it.  Machine remains in room should the Pt change his mind.

## 2022-01-20 NOTE — Progress Notes (Signed)
Physical Therapy Treatment Patient Details Name: Clinton Hurley MRN: [id number] DOB: 1950-07-23 Today's Date: 01/20/2022   History of Present Illness Pt is a 71yo male presenting s/p R-THAA on 01/18/22. PMH: afib, GERD, HTN, OSA,    PT Comments    Pt continues motivated and progressing with mobility but with reports of nausea and milder dizziness with mobility.  BP sitting 131/60; standing 110/52; standing 3 minutes 117/58 and after ambulating 112/58.   Recommendations for follow up therapy are one component of a multi-disciplinary discharge planning process, led by the attending physician.  Recommendations may be updated based on patient status, additional functional criteria and insurance authorization.  Follow Up Recommendations  Follow physician's recommendations for discharge plan and follow up therapies     Assistance Recommended at Discharge Frequent or constant Supervision/Assistance  Patient can return home with the following A little help with walking and/or transfers;A little help with bathing/dressing/bathroom;Assistance with cooking/housework;Assist for transportation;Help with stairs or ramp for entrance   Equipment Recommendations  Rolling walker (2 wheels)    Recommendations for Other Services       Precautions / Restrictions Precautions Precautions: Fall Restrictions Weight Bearing Restrictions: No RLE Weight Bearing: Weight bearing as tolerated Other Position/Activity Restrictions: WBAT     Mobility  Bed Mobility Overal bed mobility: Needs Assistance Bed Mobility: Sit to Supine     Supine to sit: Min assist Sit to supine: Min guard   General bed mobility comments: Pt self assisting R LE with L LE    Transfers Overall transfer level: Needs assistance Equipment used: Rolling walker (2 wheels) Transfers: Sit to/from Stand Sit to Stand: Min guard           General transfer comment: cues for LE management and use of UEs to self assist     Ambulation/Gait Ambulation/Gait assistance: Min assist, Min guard Gait Distance (Feet): 84 Feet Assistive device: Rolling walker (2 wheels) Gait Pattern/deviations: Step-to pattern, Decreased stance time - right, Antalgic Gait velocity: decr     General Gait Details: Increased time with verbal cues for sequence, RW positioning, step length, posture; distance ltd by fatigue and c/o mild dizziness and nausea   Stairs             Wheelchair Mobility    Modified Rankin (Stroke Patients Only)       Balance Overall balance assessment: Needs assistance Sitting-balance support: No upper extremity supported, Feet supported Sitting balance-Leahy Scale: Good     Standing balance support: No upper extremity supported Standing balance-Leahy Scale: Fair                              Cognition Arousal/Alertness: Awake/alert Behavior During Therapy: WFL for tasks assessed/performed Overall Cognitive Status: Within Functional Limits for tasks assessed                                          Exercises Total Joint Exercises Ankle Circles/Pumps: AROM, Both, 15 reps, Supine Quad Sets: AROM, 10 reps, Supine, Both Heel Slides: AAROM, Right, 20 reps, Supine Hip ABduction/ADduction: AAROM, Right, 15 reps, Supine    General Comments General comments (skin integrity, edema, etc.): wife present      Pertinent Vitals/Pain Pain Assessment Pain Assessment: 0-10 Pain Score: 6  Pain Location: right hip Pain Descriptors / Indicators: Aching, Sore Pain Intervention(s): Limited activity within patient's  tolerance, Monitored during session (Pt declined pain meds 2* concerns regarding orthostatic BP this am)    Home Living                          Prior Function            PT Goals (current goals can now be found in the care plan section) Acute Rehab PT Goals Patient Stated Goal: Go back to work PT Goal Formulation: With patient Time For  Goal Achievement: 01/25/22 Potential to Achieve Goals: Good Progress towards PT goals: Progressing toward goals    Frequency    7X/week      PT Plan Current plan remains appropriate    Co-evaluation              AM-PAC PT "6 Clicks" Mobility   Outcome Measure  Help needed turning from your back to your side while in a flat bed without using bedrails?: A Little Help needed moving from lying on your back to sitting on the side of a flat bed without using bedrails?: A Little Help needed moving to and from a bed to a chair (including a wheelchair)?: A Little Help needed standing up from a chair using your arms (e.g., wheelchair or bedside chair)?: A Little Help needed to walk in hospital room?: A Little Help needed climbing 3-5 steps with a railing? : A Lot 6 Click Score: 17    End of Session Equipment Utilized During Treatment: Gait belt Activity Tolerance: Patient tolerated treatment well Patient left: with call bell/phone within reach;with family/visitor present;in bed Nurse Communication: Mobility status PT Visit Diagnosis: Pain;Difficulty in walking, not elsewhere classified (R26.2) Pain - Right/Left: Right Pain - part of body: Hip     Time: 1423-1450 PT Time Calculation (min) (ACUTE ONLY): 27 min  Charges:  $Gait Training: 23-37 mins                     [place] Pager [phone] Office [phone]    Malayah Demuro 01/20/2022, 3:21 PM

## 2022-01-20 NOTE — Progress Notes (Signed)
Patient ID: Clinton Hurley, male   DOB: 1950-04-04, 71 y.o.   MRN: [id number] Subjective: 2 Days Post-Op Procedure(s) (LRB): TOTAL HIP ARTHROPLASTY ANTERIOR APPROACH (Right)    Patient reports pain as moderate.  Pain seems to be improved from yesterday. Spent time in the chair and mobilized some in the room.  Objective:   VITALS:   Vitals:   01/20/22 0127 01/20/22 0348  BP: (!) 103/55 (!) 132/53  Pulse: 64 87  Resp: 17 20  Temp: 98 F (36.7 C) 98.1 F (36.7 C)  SpO2: 95% 98%    Neurovascular intact Incision: dressing C/D/I  LABS Recent Labs    01/19/22 0338 01/20/22 0343  HGB 10.5* 10.2*  HCT 32.5* 30.4*  WBC 16.1* 15.1*  PLT 188 170    Recent Labs    01/19/22 0338  NA 140  K 4.1  BUN 21  CREATININE 1.39*  GLUCOSE 138*    No results for input(s): "LABPT", "INR" in the last 72 hours.   Assessment/Plan: 2 Days Post-Op Procedure(s) (LRB): TOTAL HIP ARTHROPLASTY ANTERIOR APPROACH (Right)   Up with therapy Home today after therapy RTC in 2 weeks as scheduled WBAT

## 2022-01-20 NOTE — TOC Initial Note (Signed)
Transition of Care [facility]) - Initial/Assessment Note    Patient Details  Name: Clinton Hurley MRN: [id number] Date of Birth: 17-May-1950  Transition of [facility]) CM/SW Contact:    Henrietta Dine, RN Phone Number: 01/20/2022, 10:22 AM  Clinical Narrative:                 Pt from home with spouse; the pt says he plans to return at d/c; the pt says he has transportation home; the pt says he wears glasses; he says he has no HH services or DME; pt agreed to RW from [employer]; hand written delivery ticket signed for [employer] 2-button folding walker; no TOC needs.  Expected Discharge Plan: [place] Barriers to Discharge: No Barriers Identified   Patient Goals and CMS Choice Patient states their goals for this hospitalization and ongoing recovery are:: home      Expected Discharge Plan and Services Expected Discharge Plan: [place]   Discharge Planning Services: CM Consult   Living arrangements for the past 2 months: Single Family Home Expected Discharge Date: 01/20/22               DME Arranged: [place] Rile rolling DME Agency: [employer] Date DME Agency Contacted: 01/20/22 Time DME Agency Contacted: 39 Representative spoke with at DME Agency: [place] from [employer]; patient signed hand written delivery ticket            Prior Living Arrangements/Services Living arrangements for the past 2 months: Single Family Home Lives with:: Spouse Patient language and need for interpreter reviewed:: Yes Do you feel safe going back to the place where you live?: Yes      Need for Family Participation in Patient Care: Yes (Comment) Care giver support system in place?: Yes (comment)   Criminal Activity/Legal Involvement Pertinent to Current Situation/Hospitalization: No - Comment as needed  Activities of Daily Living Home Assistive Devices/Equipment: Eyeglasses ADL Screening (condition at time of admission) Patient's cognitive ability adequate to  safely complete daily activities?: Yes Is the patient deaf or have difficulty hearing?: No Does the patient have difficulty seeing, even when wearing glasses/contacts?: No Does the patient have difficulty concentrating, remembering, or making decisions?: No Patient able to express need for assistance with ADLs?: Yes Does the patient have difficulty dressing or bathing?: No Independently performs ADLs?: Yes (appropriate for developmental age) Does the patient have difficulty walking or climbing stairs?: Yes Weakness of Legs: Both Weakness of Arms/Hands: None  Permission Sought/Granted   Permission granted to share information with : Yes, Verbal Permission Granted  Share Information with NAME: Lenor Coffin, RN, CM           Emotional Assessment Appearance:: Appears stated age Attitude/Demeanor/Rapport: Gracious Affect (typically observed): Accepting Orientation: : Oriented to Self, Oriented to Place, Oriented to  Time, Oriented to Situation Alcohol / Substance Use: Not Applicable Psych Involvement: No (comment)  Admission diagnosis:  S/P total right hip arthroplasty [O67.672] Patient Active Problem List   Diagnosis Date Noted   S/P total right hip arthroplasty 01/18/2022   Encounter for commercial driving license (CDL) exam 09/47/0962   PCP:  Yehuda Savannah, MD Pharmacy:   [facility], [place] [address] [place] [address] Phone: [phone] Fax: [id number] - [address] [place] [address] Phone: [phone]  Fax: [phone]     Social Determinants of Health (SDOH) Interventions Food Insecurity Interventions: Intervention Not Indicated Housing Interventions: Intervention Not Indicated Transportation Interventions: Intervention Not Indicated Utilities Interventions: Intervention Not Indicated  Readmission Risk  Interventions     No data to display

## 2022-01-21 DIAGNOSIS — Z87891 Personal history of nicotine dependence: Secondary | ICD-10-CM | POA: Diagnosis not present

## 2022-01-21 DIAGNOSIS — Z79899 Other long term (current) drug therapy: Secondary | ICD-10-CM | POA: Diagnosis not present

## 2022-01-21 DIAGNOSIS — G4730 Sleep apnea, unspecified: Secondary | ICD-10-CM | POA: Diagnosis present

## 2022-01-21 DIAGNOSIS — I4891 Unspecified atrial fibrillation: Secondary | ICD-10-CM | POA: Diagnosis present

## 2022-01-21 DIAGNOSIS — I10 Essential (primary) hypertension: Secondary | ICD-10-CM | POA: Diagnosis present

## 2022-01-21 DIAGNOSIS — N400 Enlarged prostate without lower urinary tract symptoms: Secondary | ICD-10-CM | POA: Diagnosis present

## 2022-01-21 DIAGNOSIS — K219 Gastro-esophageal reflux disease without esophagitis: Secondary | ICD-10-CM | POA: Diagnosis present

## 2022-01-21 DIAGNOSIS — Z801 Family history of malignant neoplasm of trachea, bronchus and lung: Secondary | ICD-10-CM | POA: Diagnosis not present

## 2022-01-21 DIAGNOSIS — F419 Anxiety disorder, unspecified: Secondary | ICD-10-CM | POA: Diagnosis present

## 2022-01-21 DIAGNOSIS — M1611 Unilateral primary osteoarthritis, right hip: Secondary | ICD-10-CM | POA: Diagnosis present

## 2022-01-21 DIAGNOSIS — I951 Orthostatic hypotension: Secondary | ICD-10-CM | POA: Diagnosis not present

## 2022-01-21 DIAGNOSIS — Z9049 Acquired absence of other specified parts of digestive tract: Secondary | ICD-10-CM | POA: Diagnosis not present

## 2022-01-21 DIAGNOSIS — Z7901 Long term (current) use of anticoagulants: Secondary | ICD-10-CM | POA: Diagnosis not present

## 2022-01-21 DIAGNOSIS — N319 Neuromuscular dysfunction of bladder, unspecified: Secondary | ICD-10-CM | POA: Diagnosis present

## 2022-01-21 MED ORDER — SODIUM CHLORIDE 0.9 % IV BOLUS
250.0000 mL | Freq: Two times a day (BID) | INTRAVENOUS | Status: DC | PRN
  Administered 2022-01-21 (×2): 250 mL via INTRAVENOUS

## 2022-01-21 MED ORDER — SULFAMETHOXAZOLE-TRIMETHOPRIM 800-160 MG PO TABS
1.0000 | ORAL_TABLET | Freq: Two times a day (BID) | ORAL | Status: DC
  Administered 2022-01-21 – 2022-01-25 (×9): 1 via ORAL
  Filled 2022-01-21 (×9): qty 1

## 2022-01-21 NOTE — Progress Notes (Signed)
Physical Therapy Treatment Patient Details Name: Clinton Hurley MRN: [id number] DOB: [birth year] Today's Date: 01/21/2022   History of Present Illness Pt is a 71yo male presenting s/p R-THAA on 01/18/22. PMH: afib, GERD, HTN, OSA,    PT Comments    Pt completed bolus and assisted to EOB sitting, to standing and to side-step up side of bed only.  Pt limited by c/o dizziness with attempted mobility.  BP supine 102/52; sit 93/51; sit x 2 min 118/60; stand 90/45; stand x 2 min 91/58.  RN in room for PT session.  Will reattempt after next bolus.  Recommendations for follow up therapy are one component of a multi-disciplinary discharge planning process, led by the attending physician.  Recommendations may be updated based on patient status, additional functional criteria and insurance authorization.  Follow Up Recommendations  Follow physician's recommendations for discharge plan and follow up therapies     Assistance Recommended at Discharge Frequent or constant Supervision/Assistance  Patient can return home with the following A little help with walking and/or transfers;A little help with bathing/dressing/bathroom;Assistance with cooking/housework;Assist for transportation;Help with stairs or ramp for entrance   Equipment Recommendations  Rolling walker (2 wheels)    Recommendations for Other Services       Precautions / Restrictions Precautions Precautions: Fall Restrictions Weight Bearing Restrictions: No RLE Weight Bearing: Weight bearing as tolerated Other Position/Activity Restrictions: WBAT     Mobility  Bed Mobility Overal bed mobility: Needs Assistance Bed Mobility: Supine to Sit     Supine to sit: Supervision     General bed mobility comments: incresaed time with cues for sequece and use of bed rails but no physical assist    Transfers Overall transfer level: Needs assistance Equipment used: Rolling walker (2 wheels) Transfers: Sit to/from Stand Sit to Stand:  Min guard           General transfer comment: cues for LE management and use of UEs to self assist    Ambulation/Gait Ambulation/Gait assistance: Min guard Gait Distance (Feet): 3 Feet Assistive device: Rolling walker (2 wheels) Gait Pattern/deviations: Step-to pattern, Decreased stance time - right, Antalgic       General Gait Details: pt side stepped up side of bed only - limited by c/o increasing dizziness.   Stairs             Wheelchair Mobility    Modified Rankin (Stroke Patients Only)       Balance Overall balance assessment: Needs assistance Sitting-balance support: No upper extremity supported, Feet supported Sitting balance-Leahy Scale: Good     Standing balance support: No upper extremity supported Standing balance-Leahy Scale: Fair                              Cognition Arousal/Alertness: Awake/alert Behavior During Therapy: WFL for tasks assessed/performed Overall Cognitive Status: Within Functional Limits for tasks assessed                                          Exercises Total Joint Exercises Ankle Circles/Pumps: AROM, Both, 15 reps, Supine Quad Sets: AROM, 10 reps, Supine, Both Heel Slides: AAROM, Right, 20 reps, Supine Hip ABduction/ADduction: AAROM, Right, 15 reps, Supine    General Comments        Pertinent Vitals/Pain Pain Assessment Pain Assessment: 0-10 Pain Score: 5  Pain Location: right hip  Pain Descriptors / Indicators: Aching, Sore Pain Intervention(s): Limited activity within patient's tolerance, Monitored during session, Premedicated before session, Ice applied    Home Living                          Prior Function            PT Goals (current goals can now be found in the care plan section) Acute Rehab PT Goals Patient Stated Goal: Go back to work PT Goal Formulation: With patient Time For Goal Achievement: 01/25/22 Potential to Achieve Goals: Good Progress towards  PT goals: Not progressing toward goals - comment (orthostatic)    Frequency    7X/week      PT Plan Current plan remains appropriate    Co-evaluation              AM-PAC PT "6 Clicks" Mobility   Outcome Measure  Help needed turning from your back to your side while in a flat bed without using bedrails?: A Little Help needed moving from lying on your back to sitting on the side of a flat bed without using bedrails?: A Little Help needed moving to and from a bed to a chair (including a wheelchair)?: A Little Help needed standing up from a chair using your arms (e.g., wheelchair or bedside chair)?: A Little Help needed to walk in hospital room?: A Little Help needed climbing 3-5 steps with a railing? : A Lot 6 Click Score: 17    End of Session Equipment Utilized During Treatment: Gait belt Activity Tolerance: Other (comment) (orthostatic) Patient left: with call bell/phone within reach;in bed Nurse Communication: Mobility status PT Visit Diagnosis: Pain;Difficulty in walking, not elsewhere classified (R26.2) Pain - Right/Left: Right Pain - part of body: Hip     Time: 3785-8850 PT Time Calculation (min) (ACUTE ONLY): 23 min  Charges:  $Therapeutic Activity: 8-22 mins                     Debe Coder PT Acute Rehabilitation Services Pager [phone] Office [phone]    Charvez Voorhies 01/21/2022, 12:29 PM

## 2022-01-21 NOTE — Progress Notes (Signed)
Physical Therapy Treatment Patient Details Name: Clinton Hurley MRN: [id number] DOB: [birth year] Today's Date: 01/21/2022   History of Present Illness Pt is a 71yo male presenting s/p R-THAA on 01/18/22. PMH: afib, GERD, HTN, OSA,    PT Comments    Pt continues very cooperative and performed therex program with assist and reviewed progression to standing therex in HEP.  OOB deferred pending completion of bolus.   Recommendations for follow up therapy are one component of a multi-disciplinary discharge planning process, led by the attending physician.  Recommendations may be updated based on patient status, additional functional criteria and insurance authorization.  Follow Up Recommendations  Follow physician's recommendations for discharge plan and follow up therapies     Assistance Recommended at Discharge Frequent or constant Supervision/Assistance  Patient can return home with the following A little help with walking and/or transfers;A little help with bathing/dressing/bathroom;Assistance with cooking/housework;Assist for transportation;Help with stairs or ramp for entrance   Equipment Recommendations  Rolling walker (2 wheels)    Recommendations for Other Services       Precautions / Restrictions Precautions Precautions: Fall Restrictions Weight Bearing Restrictions: No RLE Weight Bearing: Weight bearing as tolerated Other Position/Activity Restrictions: WBAT     Mobility  Bed Mobility               General bed mobility comments: deferred to after bolus    Transfers                        Ambulation/Gait                   Stairs             Wheelchair Mobility    Modified Rankin (Stroke Patients Only)       Balance                                            Cognition Arousal/Alertness: Awake/alert Behavior During Therapy: WFL for tasks assessed/performed Overall Cognitive Status: Within Functional  Limits for tasks assessed                                          Exercises Total Joint Exercises Ankle Circles/Pumps: AROM, Both, 15 reps, Supine Quad Sets: AROM, 10 reps, Supine, Both Heel Slides: AAROM, Right, 20 reps, Supine Hip ABduction/ADduction: AAROM, Right, 15 reps, Supine    General Comments        Pertinent Vitals/Pain Pain Assessment Pain Assessment: 0-10 Pain Score: 5  Pain Location: right hip Pain Descriptors / Indicators: Aching, Sore Pain Intervention(s): Limited activity within patient's tolerance, Monitored during session, Premedicated before session, Ice applied    Home Living                          Prior Function            PT Goals (current goals can now be found in the care plan section) Acute Rehab PT Goals Patient Stated Goal: Go back to work PT Goal Formulation: With patient Time For Goal Achievement: 01/25/22 Potential to Achieve Goals: Good Progress towards PT goals: Progressing toward goals    Frequency    7X/week      PT Plan  Current plan remains appropriate    Co-evaluation              AM-PAC PT "6 Clicks" Mobility   Outcome Measure  Help needed turning from your back to your side while in a flat bed without using bedrails?: A Little Help needed moving from lying on your back to sitting on the side of a flat bed without using bedrails?: A Little Help needed moving to and from a bed to a chair (including a wheelchair)?: A Little Help needed standing up from a chair using your arms (e.g., wheelchair or bedside chair)?: A Little Help needed to walk in hospital room?: A Little Help needed climbing 3-5 steps with a railing? : A Lot 6 Click Score: 17    End of Session   Activity Tolerance: Patient tolerated treatment well Patient left: with call bell/phone within reach;in bed   PT Visit Diagnosis: Pain;Difficulty in walking, not elsewhere classified (R26.2) Pain - Right/Left: Right Pain -  part of body: Hip     Time: 7096-4383 PT Time Calculation (min) (ACUTE ONLY): 18 min  Charges:  $Therapeutic Exercise: 8-22 mins                     Clinton Hurley PT Acute Rehabilitation Services Pager [phone] Office [phone]    Clinton Hurley 01/21/2022, 8:41 AM

## 2022-01-21 NOTE — Progress Notes (Signed)
Patient ID: Clinton Hurley, male   DOB: 09-20-1950, 71 y.o.   MRN: [id number] Subjective: 3 Days Post-Op Procedure(s) (LRB): TOTAL HIP ARTHROPLASTY ANTERIOR APPROACH (Right)    Patient reports pain as mild to moderate Issues pertaining to mild orthostatic hypotension noted with therapy   Objective:   VITALS:   Vitals:   01/20/22 2109 01/21/22 0624  BP: (!) 120/56 (!) 121/57  Pulse: 75 72  Resp: 20 20  Temp: 98.1 F (36.7 C) 98.6 F (37 C)  SpO2: 97% 97%    Neurovascular intact Incision: dressing C/D/I  LABS Recent Labs    01/19/22 0338 01/20/22 0343  HGB 10.5* 10.2*  HCT 32.5* 30.4*  WBC 16.1* 15.1*  PLT 188 170    Recent Labs    01/19/22 0338  NA 140  K 4.1  BUN 21  CREATININE 1.39*  GLUCOSE 138*    No results for input(s): "LABPT", "INR" in the last 72 hours.   Assessment/Plan: 3 Days Post-Op Procedure(s) (LRB): TOTAL HIP ARTHROPLASTY ANTERIOR APPROACH (Right)   Up with therapy HGb stable Not on antihypertensives  Consider 250 cc ND bolus prior to therapy  Home if safely progresses with therapy today

## 2022-01-21 NOTE — Progress Notes (Signed)
Physical Therapy Treatment Patient Details Name: Clinton Hurley MRN: [id number] DOB: Nov 15, 1950 Today's Date: 01/21/2022   History of Present Illness Pt is a 71yo male presenting s/p R-THAA on 01/18/22. PMH: afib, GERD, HTN, OSA,    PT Comments    Pt in good spirits but with continued dizziness with attempts at OOB activity.  BP supine 97/50; sit 101/53; stand 92/53; stand 3 min 91/54 with increasing dizziness; and after transfer to chair 115/61 with dizziness slowly resolving - RN aware.  Recommendations for follow up therapy are one component of a multi-disciplinary discharge planning process, led by the attending physician.  Recommendations may be updated based on patient status, additional functional criteria and insurance authorization.  Follow Up Recommendations  Follow physician's recommendations for discharge plan and follow up therapies     Assistance Recommended at Discharge Frequent or constant Supervision/Assistance  Patient can return home with the following A little help with walking and/or transfers;A little help with bathing/dressing/bathroom;Assistance with cooking/housework;Assist for transportation;Help with stairs or ramp for entrance   Equipment Recommendations  Rolling walker (2 wheels)    Recommendations for Other Services       Precautions / Restrictions Precautions Precautions: Fall Restrictions Weight Bearing Restrictions: No RLE Weight Bearing: Weight bearing as tolerated Other Position/Activity Restrictions: WBAT     Mobility  Bed Mobility Overal bed mobility: Needs Assistance Bed Mobility: Supine to Sit     Supine to sit: Supervision     General bed mobility comments: incresaed time with cues for sequece and use of bed rails but no physical assist    Transfers Overall transfer level: Needs assistance Equipment used: Rolling walker (2 wheels) Transfers: Sit to/from Stand, Bed to chair/wheelchair/BSC Sit to Stand: Min guard   Step  pivot transfers: Min guard       General transfer comment: cues for LE management and use of UEs to self assist    Ambulation/Gait Ambulation/Gait assistance: Min guard Gait Distance (Feet): 3 Feet Assistive device: Rolling walker (2 wheels) Gait Pattern/deviations: Step-to pattern, Decreased stance time - right, Antalgic       General Gait Details: step pvt to chair only 2* dizziness   Stairs             Wheelchair Mobility    Modified Rankin (Stroke Patients Only)       Balance Overall balance assessment: Needs assistance Sitting-balance support: No upper extremity supported, Feet supported Sitting balance-Leahy Scale: Good     Standing balance support: No upper extremity supported Standing balance-Leahy Scale: Fair                              Cognition Arousal/Alertness: Awake/alert Behavior During Therapy: WFL for tasks assessed/performed Overall Cognitive Status: Within Functional Limits for tasks assessed                                          Exercises Total Joint Exercises Ankle Circles/Pumps: AROM, Both, 15 reps, Supine Quad Sets: AROM, 10 reps, Supine, Both Heel Slides: AAROM, Right, 20 reps, Supine Hip ABduction/ADduction: AAROM, Right, 15 reps, Supine    General Comments        Pertinent Vitals/Pain Pain Assessment Pain Assessment: 0-10 Pain Score: 4  Pain Location: right hip Pain Descriptors / Indicators: Aching, Sore Pain Intervention(s): Limited activity within patient's tolerance, Monitored during session, Premedicated before  session, Ice applied    Home Living                          Prior Function            PT Goals (current goals can now be found in the care plan section) Acute Rehab PT Goals Patient Stated Goal: Go back to work PT Goal Formulation: With patient Time For Goal Achievement: 01/25/22 Potential to Achieve Goals: Good Progress towards PT goals: Not progressing  toward goals - comment (ltd by dizziness with attempts at OOB activity)    Frequency    7X/week      PT Plan Current plan remains appropriate    Co-evaluation              AM-PAC PT "6 Clicks" Mobility   Outcome Measure  Help needed turning from your back to your side while in a flat bed without using bedrails?: A Little Help needed moving from lying on your back to sitting on the side of a flat bed without using bedrails?: A Little Help needed moving to and from a bed to a chair (including a wheelchair)?: A Little Help needed standing up from a chair using your arms (e.g., wheelchair or bedside chair)?: A Little Help needed to walk in hospital room?: A Little Help needed climbing 3-5 steps with a railing? : A Lot 6 Click Score: 17    End of Session Equipment Utilized During Treatment: Gait belt Activity Tolerance: Other (comment) (dizzy) Patient left: with call bell/phone within reach;in chair;with family/visitor present Nurse Communication: Mobility status PT Visit Diagnosis: Pain;Difficulty in walking, not elsewhere classified (R26.2) Pain - Right/Left: Right Pain - part of body: Hip     Time: 4562-5638 PT Time Calculation (min) (ACUTE ONLY): 26 min  Charges:  $Therapeutic Activity: 8-22 mins                     [name]     [name] 01/21/2022, 3:47 PM

## 2022-01-21 NOTE — Plan of Care (Signed)

## 2022-01-21 NOTE — Progress Notes (Signed)
Patient refuses CPAP for tonight.  

## 2022-01-22 NOTE — Progress Notes (Signed)
Physical Therapy Treatment Patient Details Name: Clinton Hurley MRN: [id number] DOB: 1951-01-18 Today's Date: 01/22/2022   History of Present Illness Pt is a 71yo male presenting s/p R-THAA on 01/18/22. PMH: afib, GERD, HTN, OSA,    PT Comments    Pt continues very cooperative and performed HEP with assist and with written instruction provided and reviewed including exercise progression.  Pt up to ambulate in hall with mild dizziness and BP as follows; supine 115/58; sitting 114/61; standing 94/56; 3 min stand 94/53; after walking 103/57 and immediately after sitting 130/65.  HR elevated to 113 with ambulation.  Recommendations for follow up therapy are one component of a multi-disciplinary discharge planning process, led by the attending physician.  Recommendations may be updated based on patient status, additional functional criteria and insurance authorization.  Follow Up Recommendations  Follow physician's recommendations for discharge plan and follow up therapies     Assistance Recommended at Discharge Intermittent Supervision/Assistance  Patient can return home with the following A little help with walking and/or transfers;A little help with bathing/dressing/bathroom;Assistance with cooking/housework;Assist for transportation;Help with stairs or ramp for entrance   Equipment Recommendations  Rolling walker (2 wheels)    Recommendations for Other Services       Precautions / Restrictions Precautions Precautions: Fall Restrictions Weight Bearing Restrictions: No RLE Weight Bearing: Weight bearing as tolerated Other Position/Activity Restrictions: WBAT     Mobility  Bed Mobility Overal bed mobility: Needs Assistance Bed Mobility: Supine to Sit     Supine to sit: Supervision Sit to supine: Min guard   General bed mobility comments: min cues for sequence; pt self assisting R LE with L LE    Transfers Overall transfer level: Needs assistance Equipment used: Rolling  walker (2 wheels) Transfers: Sit to/from Stand Sit to Stand: Min guard   Step pivot transfers: Min guard       General transfer comment: min cues for LE management and use of UEs to self assist    Ambulation/Gait Ambulation/Gait assistance: Min guard Gait Distance (Feet): 58 Feet Assistive device: Rolling walker (2 wheels) Gait Pattern/deviations: Step-to pattern, Decreased stance time - right, Antalgic Gait velocity: decr     General Gait Details: cues for posture and position from RW.   Stairs             Wheelchair Mobility    Modified Rankin (Stroke Patients Only)       Balance Overall balance assessment: Needs assistance Sitting-balance support: No upper extremity supported, Feet supported Sitting balance-Leahy Scale: Good     Standing balance support: No upper extremity supported Standing balance-Leahy Scale: Fair                              Cognition Arousal/Alertness: Awake/alert Behavior During Therapy: WFL for tasks assessed/performed Overall Cognitive Status: Within Functional Limits for tasks assessed                                          Exercises Total Joint Exercises Ankle Circles/Pumps: AROM, Both, 15 reps, Supine Quad Sets: AROM, 10 reps, Supine, Both Heel Slides: AAROM, Right, 20 reps, Supine Hip ABduction/ADduction: AAROM, Right, 15 reps, Supine    General Comments General comments (skin integrity, edema, etc.): spouse present      Pertinent Vitals/Pain Pain Assessment Pain Assessment: 0-10 Pain Score: 4  Pain Location:  right hip Pain Descriptors / Indicators: Aching, Sore Pain Intervention(s): Limited activity within patient's tolerance, Monitored during session, Premedicated before session, Ice applied    Home Living                          Prior Function            PT Goals (current goals can now be found in the care plan section) Acute Rehab PT Goals Patient Stated  Goal: Go back to work PT Goal Formulation: With patient Time For Goal Achievement: 01/25/22 Potential to Achieve Goals: Good Progress towards PT goals: Progressing toward goals    Frequency    7X/week      PT Plan Current plan remains appropriate    Co-evaluation              AM-PAC PT "6 Clicks" Mobility   Outcome Measure  Help needed turning from your back to your side while in a flat bed without using bedrails?: A Little Help needed moving from lying on your back to sitting on the side of a flat bed without using bedrails?: A Little Help needed moving to and from a bed to a chair (including a wheelchair)?: A Little Help needed standing up from a chair using your arms (e.g., wheelchair or bedside chair)?: A Little Help needed to walk in hospital room?: A Little Help needed climbing 3-5 steps with a railing? : A Lot 6 Click Score: 17    End of Session Equipment Utilized During Treatment: Gait belt Activity Tolerance: Patient limited by fatigue Patient left: in chair;with call bell/phone within reach;with chair alarm set;with family/visitor present Nurse Communication: Mobility status PT Visit Diagnosis: Pain;Difficulty in walking, not elsewhere classified (R26.2) Pain - Right/Left: Right Pain - part of body: Hip     Time: 0037-0488 PT Time Calculation (min) (ACUTE ONLY): 39 min  Charges:  $Gait Training: 8-22 mins $Therapeutic Exercise: 8-22 mins $Therapeutic Activity: 8-22 mins                     Debe Coder PT Acute Rehabilitation Services Pager [phone] Office [phone]    Clinton Hurley 01/22/2022, 1:26 PM

## 2022-01-22 NOTE — Progress Notes (Signed)
Subjective: 4 Days Post-Op Procedure(s) (LRB): TOTAL HIP ARTHROPLASTY ANTERIOR APPROACH (Right) Patient reports pain as mild.   Was sitting in the recliner when came in and spoke with the patient today Been having some difficulty with some continued orthostatic hypotension when getting up with physical therapy.  He has been getting boluses of fluid prior to PT does not seem to be helping.  Objective: Vital signs in last 24 hours: Temp:  [98 F (36.7 C)-99.3 F (37.4 C)] 98.9 F (37.2 C) (11/25 0612) Pulse Rate:  [77-98] 77 (11/25 0612) Resp:  [15-18] 15 (11/25 0612) BP: (104-119)/(58-65) 119/58 (11/25 0612) SpO2:  [93 %-96 %] 93 % (11/25 0612)  Intake/Output from previous day: 11/24 0701 - 11/25 0700 In: 740 [P.O.:240; IV Piggyback:500] Out: 6100 [Urine:6100] Intake/Output this shift: Total I/O In: 240 [P.O.:240] Out: 700 [Urine:700]  Recent Labs    01/20/22 0343  HGB 10.2*   Recent Labs    01/20/22 0343  WBC 15.1*  RBC 3.20*  HCT 30.4*  PLT 170   No results for input(s): "NA", "K", "CL", "CO2", "BUN", "CREATININE", "GLUCOSE", "CALCIUM" in the last 72 hours. No results for input(s): "LABPT", "INR" in the last 72 hours.  Neurologically intact Neurovascular intact Sensation intact distally Intact pulses distally Dorsiflexion/Plantar flexion intact Incision: dressing C/D/I No cellulitis present Compartment soft   Assessment/Plan: 4 Days Post-Op Procedure(s) (LRB): TOTAL HIP ARTHROPLASTY ANTERIOR APPROACH (Right) Up with therapy we will see how he does with physical therapy today in house blood pressure holds up. If patient does well with physical therapy hopefully discharge later today or tomorrow morning   Drue Novel, PA-C [facility] with Dr. Theda Sers [phone] 01/22/2022, 8:40 AM

## 2022-01-22 NOTE — Progress Notes (Signed)
Physical Therapy Treatment Patient Details Name: Clinton Hurley MRN: [id number] DOB: 1951/01/14 Today's Date: 01/22/2022   History of Present Illness Pt is a 71yo male presenting s/p R-THAA on 01/18/22. PMH: afib, GERD, HTN, OSA,    PT Comments    Pt continues very cooperative and requests to try mobilizing without pain premed 2* concerns about BP dropping.  Pt stood with min guard but returned to sitting after 2 min standing 2* c/o increasing dizziness.  BP sitting 123/61; standing 108/50; and standing 2 min 119/58 but with HR elevated to 115.    Recommendations for follow up therapy are one component of a multi-disciplinary discharge planning process, led by the attending physician.  Recommendations may be updated based on patient status, additional functional criteria and insurance authorization.  Follow Up Recommendations  Follow physician's recommendations for discharge plan and follow up therapies     Assistance Recommended at Discharge Frequent or constant Supervision/Assistance  Patient can return home with the following A little help with walking and/or transfers;A little help with bathing/dressing/bathroom;Assistance with cooking/housework;Assist for transportation;Help with stairs or ramp for entrance   Equipment Recommendations  Rolling walker (2 wheels)    Recommendations for Other Services       Precautions / Restrictions Precautions Precautions: Fall Restrictions Weight Bearing Restrictions: No RLE Weight Bearing: Weight bearing as tolerated     Mobility  Bed Mobility               General bed mobility comments: up in chair and requests back to same    Transfers Overall transfer level: Needs assistance Equipment used: Rolling walker (2 wheels) Transfers: Sit to/from Stand Sit to Stand: Min guard           General transfer comment: min cues for LE management and use of UEs to self assist    Ambulation/Gait         Gait velocity: decr      General Gait Details: Pt stood only - returned to sitting with c/o increasing dizziness   Stairs             Wheelchair Mobility    Modified Rankin (Stroke Patients Only)       Balance Overall balance assessment: Needs assistance Sitting-balance support: No upper extremity supported, Feet supported Sitting balance-Leahy Scale: Good     Standing balance support: No upper extremity supported Standing balance-Leahy Scale: Fair                              Cognition Arousal/Alertness: Awake/alert Behavior During Therapy: WFL for tasks assessed/performed Overall Cognitive Status: Within Functional Limits for tasks assessed                                          Exercises      General Comments        Pertinent Vitals/Pain Pain Assessment Pain Assessment: 0-10 Pain Score: 6  Pain Location: right hip Pain Descriptors / Indicators: Aching, Sore Pain Intervention(s): Limited activity within patient's tolerance, Monitored during session, Patient requesting pain meds-RN notified, Ice applied    Home Living                          Prior Function            PT Goals (current goals  can now be found in the care plan section) Acute Rehab PT Goals Patient Stated Goal: Go back to work PT Goal Formulation: With patient Time For Goal Achievement: 01/25/22 Potential to Achieve Goals: Good Progress towards PT goals: Not progressing toward goals - comment (continued dizziness with attempts to mobilize)    Frequency    7X/week      PT Plan Current plan remains appropriate    Co-evaluation              AM-PAC PT "6 Clicks" Mobility   Outcome Measure  Help needed turning from your back to your side while in a flat bed without using bedrails?: A Little Help needed moving from lying on your back to sitting on the side of a flat bed without using bedrails?: A Little Help needed moving to and from a bed to a chair  (including a wheelchair)?: A Little Help needed standing up from a chair using your arms (e.g., wheelchair or bedside chair)?: A Little Help needed to walk in hospital room?: A Little Help needed climbing 3-5 steps with a railing? : A Lot 6 Click Score: 17    End of Session Equipment Utilized During Treatment: Gait belt   Patient left: with call bell/phone within reach;in bed;with bed alarm set Nurse Communication: Mobility status PT Visit Diagnosis: Pain;Difficulty in walking, not elsewhere classified (R26.2) Pain - Right/Left: Right Pain - part of body: Hip     Time: 7564-3329 PT Time Calculation (min) (ACUTE ONLY): 17 min  Charges:  $Therapeutic Activity: 8-22 mins                     Debe Coder PT Acute Rehabilitation Services Pager [phone] Office [phone]    Heaven Meeker 01/22/2022, 9:56 AM

## 2022-01-22 NOTE — Plan of Care (Signed)
  Problem: Activity: Goal: Risk for activity intolerance will decrease Outcome: Progressing   Problem: Safety: Goal: Ability to remain free from injury will improve Outcome: Progressing   Problem: Pain Management: Goal: Pain level will decrease with appropriate interventions Outcome: Progressing

## 2022-01-22 NOTE — Plan of Care (Signed)
Plan of care reviewed and discussed with the patient. 

## 2022-01-22 NOTE — Progress Notes (Signed)
Patient refuses cpap 

## 2022-01-22 NOTE — Progress Notes (Signed)
Physical Therapy Treatment Patient Details Name: Clinton Hurley MRN: [id number] DOB: [birth year] Today's Date: 01/22/2022   History of Present Illness Pt is a 71yo male presenting s/p R-THAA on 01/18/22. PMH: afib, GERD, HTN, OSA,    PT Comments    Arrived to room for therex and possibly to re-attempt mobility.  On standing, pt states "I'm so tired I can't evan tell if I am dizzy, I just need to lay down.  Pt assisted back to bed and therex deferred to later.   Recommendations for follow up therapy are one component of a multi-disciplinary discharge planning process, led by the attending physician.  Recommendations may be updated based on patient status, additional functional criteria and insurance authorization.  Follow Up Recommendations  Follow physician's recommendations for discharge plan and follow up therapies     Assistance Recommended at Discharge Intermittent Supervision/Assistance  Patient can return home with the following A little help with walking and/or transfers;A little help with bathing/dressing/bathroom;Assistance with cooking/housework;Assist for transportation;Help with stairs or ramp for entrance   Equipment Recommendations  Rolling walker (2 wheels)    Recommendations for Other Services       Precautions / Restrictions Precautions Precautions: Fall Restrictions Weight Bearing Restrictions: No RLE Weight Bearing: Weight bearing as tolerated Other Position/Activity Restrictions: WBAT     Mobility  Bed Mobility Overal bed mobility: Needs Assistance Bed Mobility: Sit to Supine       Sit to supine: Min guard   General bed mobility comments: min cues for sequence; pt self assisting R LE with L LE    Transfers Overall transfer level: Needs assistance Equipment used: Rolling walker (2 wheels) Transfers: Sit to/from Stand, Bed to chair/wheelchair/BSC Sit to Stand: Min guard   Step pivot transfers: Min guard       General transfer comment: min  cues for LE management and use of UEs to self assist    Ambulation/Gait         Gait velocity: decr     General Gait Details: step pvt chair to bed only   Stairs             Wheelchair Mobility    Modified Rankin (Stroke Patients Only)       Balance Overall balance assessment: Needs assistance Sitting-balance support: No upper extremity supported, Feet supported Sitting balance-Leahy Scale: Good     Standing balance support: No upper extremity supported Standing balance-Leahy Scale: Fair                              Cognition Arousal/Alertness: Awake/alert Behavior During Therapy: WFL for tasks assessed/performed Overall Cognitive Status: Within Functional Limits for tasks assessed                                          Exercises      General Comments        Pertinent Vitals/Pain Pain Assessment Pain Assessment: 0-10 Pain Score: 4  Pain Location: right hip Pain Descriptors / Indicators: Aching, Sore Pain Intervention(s): Limited activity within patient's tolerance, Monitored during session, Premedicated before session, Ice applied    Home Living                          Prior Function  PT Goals (current goals can now be found in the care plan section) Acute Rehab PT Goals Patient Stated Goal: Go back to work PT Goal Formulation: With patient Time For Goal Achievement: 01/25/22 Potential to Achieve Goals: Good Progress towards PT goals: Not progressing toward goals - comment (fatigue)    Frequency    7X/week      PT Plan Current plan remains appropriate    Co-evaluation              AM-PAC PT "6 Clicks" Mobility   Outcome Measure  Help needed turning from your back to your side while in a flat bed without using bedrails?: A Little Help needed moving from lying on your back to sitting on the side of a flat bed without using bedrails?: A Little Help needed moving to and  from a bed to a chair (including a wheelchair)?: A Little Help needed standing up from a chair using your arms (e.g., wheelchair or bedside chair)?: A Little Help needed to walk in hospital room?: A Little Help needed climbing 3-5 steps with a railing? : A Lot 6 Click Score: 17    End of Session Equipment Utilized During Treatment: Gait belt Activity Tolerance: Patient limited by fatigue Patient left: with call bell/phone within reach;in bed;with bed alarm set Nurse Communication: Mobility status PT Visit Diagnosis: Pain;Difficulty in walking, not elsewhere classified (R26.2) Pain - Right/Left: Right Pain - part of body: Hip     Time: 8938-1017 PT Time Calculation (min) (ACUTE ONLY): 9 min  Charges:  $Therapeutic Activity: 8-22 mins                     Debe Coder PT Acute Rehabilitation Services Pager [phone] Office [phone]    Taurean Ju 01/22/2022, 11:06 AM

## 2022-01-22 NOTE — Progress Notes (Signed)
Physical Therapy Treatment Patient Details Name: Clinton Hurley MRN: [id number] DOB: 1950-03-12 Today's Date: 01/22/2022   History of Present Illness Pt is a 71yo male presenting s/p R-THAA on 01/18/22. PMH: afib, GERD, HTN, OSA,    PT Comments    Pt continues very cooperative but with continued reports of mild dizziness with standing/ambulating.  BP supine 115/57; sitting 117/53; standing 93/68; after ambulating short distance in hall 131/61.  RN aware.   Recommendations for follow up therapy are one component of a multi-disciplinary discharge planning process, led by the attending physician.  Recommendations may be updated based on patient status, additional functional criteria and insurance authorization.  Follow Up Recommendations  Follow physician's recommendations for discharge plan and follow up therapies     Assistance Recommended at Discharge Intermittent Supervision/Assistance  Patient can return home with the following A little help with walking and/or transfers;A little help with bathing/dressing/bathroom;Assistance with cooking/housework;Assist for transportation;Help with stairs or ramp for entrance   Equipment Recommendations  Rolling walker (2 wheels)    Recommendations for Other Services       Precautions / Restrictions Precautions Precautions: Fall Restrictions Weight Bearing Restrictions: No RLE Weight Bearing: Weight bearing as tolerated Other Position/Activity Restrictions: WBAT     Mobility  Bed Mobility Overal bed mobility: Needs Assistance Bed Mobility: Sit to Supine     Supine to sit: Supervision Sit to supine: Min guard   General bed mobility comments: min cues for sequence; pt self assisting R LE with L LE    Transfers Overall transfer level: Needs assistance Equipment used: Rolling walker (2 wheels) Transfers: Sit to/from Stand Sit to Stand: Min guard   Step pivot transfers: Min guard       General transfer comment: min cues for  LE management and use of UEs to self assist    Ambulation/Gait Ambulation/Gait assistance: Min guard Gait Distance (Feet): 46 Feet Assistive device: Rolling walker (2 wheels) Gait Pattern/deviations: Step-to pattern, Decreased stance time - right, Antalgic Gait velocity: decr     General Gait Details: cues for posture and position from RW.   Stairs Stairs: Yes Stairs assistance: Min assist Stair Management: Two rails, Step to pattern, Forwards Number of Stairs: 3 General stair comments: cues for sequence   Wheelchair Mobility    Modified Rankin (Stroke Patients Only)       Balance Overall balance assessment: Needs assistance Sitting-balance support: No upper extremity supported, Feet supported Sitting balance-Leahy Scale: Good     Standing balance support: No upper extremity supported Standing balance-Leahy Scale: Fair                              Cognition Arousal/Alertness: Awake/alert Behavior During Therapy: WFL for tasks assessed/performed Overall Cognitive Status: Within Functional Limits for tasks assessed                                          Exercises Total Joint Exercises Ankle Circles/Pumps: AROM, Both, 15 reps, Supine Quad Sets: AROM, 10 reps, Supine, Both Heel Slides: AAROM, Right, 20 reps, Supine Hip ABduction/ADduction: AAROM, Right, 15 reps, Supine    General Comments General comments (skin integrity, edema, etc.): spouse present      Pertinent Vitals/Pain Pain Assessment Pain Assessment: 0-10 Pain Score: 4  Pain Location: right hip Pain Descriptors / Indicators: Aching, Sore Pain Intervention(s): Limited activity  within patient's tolerance, Monitored during session, Ice applied    Home Living                          Prior Function            PT Goals (current goals can now be found in the care plan section) Acute Rehab PT Goals Patient Stated Goal: Go back to work PT Goal  Formulation: With patient Time For Goal Achievement: 01/25/22 Potential to Achieve Goals: Good Progress towards PT goals: Progressing toward goals    Frequency    7X/week      PT Plan Current plan remains appropriate    Co-evaluation              AM-PAC PT "6 Clicks" Mobility   Outcome Measure  Help needed turning from your back to your side while in a flat bed without using bedrails?: A Little Help needed moving from lying on your back to sitting on the side of a flat bed without using bedrails?: A Little Help needed moving to and from a bed to a chair (including a wheelchair)?: A Little Help needed standing up from a chair using your arms (e.g., wheelchair or bedside chair)?: A Little Help needed to walk in hospital room?: A Little Help needed climbing 3-5 steps with a railing? : A Little 6 Click Score: 18    End of Session Equipment Utilized During Treatment: Gait belt Activity Tolerance: Patient limited by fatigue Patient left: in bed;with call bell/phone within reach;with family/visitor present Nurse Communication: Mobility status PT Visit Diagnosis: Pain;Difficulty in walking, not elsewhere classified (R26.2) Pain - Right/Left: Right Pain - part of body: Hip     Time: 1420-1445 PT Time Calculation (min) (ACUTE ONLY): 25 min  Charges:  $Gait Training: 8-22 mins $Therapeutic Exercise: 8-22 mins                     Debe Coder PT Acute Rehabilitation Services Pager [phone] Office [phone]    Jaren Kearn 01/22/2022, 3:05 PM

## 2022-01-22 NOTE — Plan of Care (Signed)
Plan of care reviewed and discussed. Problem: Education: Goal: Knowledge of General Education information will improve Description: Including pain rating scale, medication(s)/side effects and non-pharmacologic comfort measures Outcome: Adequate for Discharge   Problem: Health Behavior/Discharge Planning: Goal: Ability to manage health-related needs will improve Outcome: Adequate for Discharge   Problem: Clinical Measurements: Goal: Ability to maintain clinical measurements within normal limits will improve Outcome: Progressing Goal: Will remain free from infection Outcome: Progressing Goal: Diagnostic test results will improve Outcome: Progressing Goal: Respiratory complications will improve Outcome: Adequate for Discharge Goal: Cardiovascular complication will be avoided Outcome: Progressing   Problem: Activity: Goal: Risk for activity intolerance will decrease Outcome: Progressing   Problem: Nutrition: Goal: Adequate nutrition will be maintained Outcome: Completed/Met   Problem: Coping: Goal: Level of anxiety will decrease Outcome: Progressing   Problem: Elimination: Goal: Will not experience complications related to bowel motility Outcome: Completed/Met Goal: Will not experience complications related to urinary retention Outcome: Not Met (add Reason)

## 2022-01-23 NOTE — Progress Notes (Signed)
Physical Therapy Treatment Patient Details Name: Clinton Hurley MRN: [id number] DOB: 05/07/1950 Today's Date: 01/23/2022   History of Present Illness Pt is a 71yo male presenting s/p R-THAA on 01/18/22. PMH: afib, GERD, HTN, OSA,    PT Comments    Pt continues very cooperative but expressing frustration over continued delays of dc 2* orthostatic BP.  This am, pt with not pain meds since prior to midnight and BP supine 103/60; sitting 100/56; stand 85/47; and marching in place 1 min 81/41 with reports of dizziness.  Pt assisted to bed and reports suddenly feeling cold and shivering.  RN aware.   Recommendations for follow up therapy are one component of a multi-disciplinary discharge planning process, led by the attending physician.  Recommendations may be updated based on patient status, additional functional criteria and insurance authorization.  Follow Up Recommendations  Follow physician's recommendations for discharge plan and follow up therapies     Assistance Recommended at Discharge Intermittent Supervision/Assistance  Patient can return home with the following A little help with walking and/or transfers;A little help with bathing/dressing/bathroom;Assistance with cooking/housework;Assist for transportation;Help with stairs or ramp for entrance   Equipment Recommendations  Rolling walker (2 wheels)    Recommendations for Other Services       Precautions / Restrictions Precautions Precautions: Fall Restrictions Weight Bearing Restrictions: No RLE Weight Bearing: Weight bearing as tolerated Other Position/Activity Restrictions: WBAT     Mobility  Bed Mobility Overal bed mobility: Needs Assistance Bed Mobility: Supine to Sit     Supine to sit: Supervision     General bed mobility comments: Increased time with min cues for sequence; pt self assisting R LE with L LE    Transfers Overall transfer level: Needs assistance Equipment used: Rolling walker (2  wheels) Transfers: Sit to/from Stand Sit to Stand: Min guard           General transfer comment: min cues for LE management and use of UEs to self assist    Ambulation/Gait         Gait velocity: decr     General Gait Details: stepping in place at bed side only 2* orthostatic BP   Stairs             Wheelchair Mobility    Modified Rankin (Stroke Patients Only)       Balance Overall balance assessment: Needs assistance Sitting-balance support: No upper extremity supported, Feet supported Sitting balance-Leahy Scale: Good     Standing balance support: No upper extremity supported Standing balance-Leahy Scale: Fair                              Cognition Arousal/Alertness: Awake/alert Behavior During Therapy: WFL for tasks assessed/performed Overall Cognitive Status: Within Functional Limits for tasks assessed                                          Exercises Total Joint Exercises Ankle Circles/Pumps: AROM, Both, 15 reps, Supine Heel Slides: AAROM, Right, Supine, Other reps (comment) (2 reps before requesting pain meds previously declined)    General Comments General comments (skin integrity, edema, etc.): dtr present      Pertinent Vitals/Pain Pain Assessment Pain Assessment: 0-10 Pain Score: 6  Pain Location: right hip Pain Descriptors / Indicators: Aching, Sore Pain Intervention(s): Limited activity within patient's tolerance, Monitored during session,  Patient requesting pain meds-RN notified (Pt initially declines premed but with initiation of movement requests pain meds)    Home Living                          Prior Function            PT Goals (current goals can now be found in the care plan section) Acute Rehab PT Goals Patient Stated Goal: Go back to work PT Goal Formulation: With patient Time For Goal Achievement: 01/25/22 Potential to Achieve Goals: Good Progress towards PT goals: Not  progressing toward goals - comment (orthostatic)    Frequency    7X/week      PT Plan Current plan remains appropriate    Co-evaluation              AM-PAC PT "6 Clicks" Mobility   Outcome Measure  Help needed turning from your back to your side while in a flat bed without using bedrails?: A Little Help needed moving from lying on your back to sitting on the side of a flat bed without using bedrails?: A Little Help needed moving to and from a bed to a chair (including a wheelchair)?: A Little Help needed standing up from a chair using your arms (e.g., wheelchair or bedside chair)?: A Little Help needed to walk in hospital room?: A Little Help needed climbing 3-5 steps with a railing? : A Little 6 Click Score: 18    End of Session Equipment Utilized During Treatment: Gait belt Activity Tolerance: Patient limited by fatigue;Other (comment) (orthostatic) Patient left: in bed;with call bell/phone within reach;with family/visitor present;with nursing/sitter in room Nurse Communication: Mobility status PT Visit Diagnosis: Pain;Difficulty in walking, not elsewhere classified (R26.2) Pain - Right/Left: Right Pain - part of body: Hip     Time: 0833-0900 PT Time Calculation (min) (ACUTE ONLY): 27 min  Charges:  $Therapeutic Activity: 8-22 mins                     [place] Pager [phone] Office [place] 01/23/2022, 10:33 AM

## 2022-01-23 NOTE — Progress Notes (Signed)
   Subjective: 5 Days Post-Op Procedure(s) (LRB): TOTAL HIP ARTHROPLASTY ANTERIOR APPROACH (Right)  Pt c/o moderate pain/soreness in the right hip Also c/o mild pain to right front tooth and feels like it is loose Therapy has been limited due to orthostatic blood pressure when he stands  Otherwise doing fair Patient reports pain as moderate.  Objective:   VITALS:   Vitals:   01/22/22 2142 01/23/22 0502  BP: (!) 149/61 (!) 136/58  Pulse: 79 70  Resp: 20 18  Temp: 99.1 F (37.3 C) 98.6 F (37 C)  SpO2: 97% 96%    Right hip incision healing well Nv intact distally No rashes or edema distally Guarded rom with log roll and movement  LABS No results for input(s): "HGB", "HCT", "WBC", "PLT" in the last 72 hours.  No results for input(s): "NA", "K", "BUN", "CREATININE", "GLUCOSE" in the last 72 hours.   Assessment/Plan: 5 Days Post-Op Procedure(s) (LRB): TOTAL HIP ARTHROPLASTY ANTERIOR APPROACH (Right) Continue PT/OT If he passes physical therapy and has no dizziness may consider d/c home today vs tomorrow Continue pain management Will monitor his progress     Merla Riches PA-C, [place] [facility] now [employer] [facility] [address], [place], [place], [place] [address] Phone: [phone] [email or web address] Facebook  [employer]

## 2022-01-23 NOTE — Progress Notes (Signed)
Physical Therapy Treatment Patient Details Name: Clinton Hurley MRN: [id number] DOB: 05-08-1950 Today's Date: 01/23/2022   History of Present Illness Pt is a 71yo male presenting s/p R-THAA on 01/18/22. PMH: afib, GERD, HTN, OSA,    PT Comments    Pt in fair spirits and continues motivated to progress.  Pt performed therex program with assist and up to ambulate in hall with reports of mild dizziness decreased from am session.  BP seated 130/68; stand 96/59 and after walking 110/59 with HR elevated to 110.  RN aware.   Recommendations for follow up therapy are one component of a multi-disciplinary discharge planning process, led by the attending physician.  Recommendations may be updated based on patient status, additional functional criteria and insurance authorization.  Follow Up Recommendations  Follow physician's recommendations for discharge plan and follow up therapies     Assistance Recommended at Discharge Intermittent Supervision/Assistance  Patient can return home with the following A little help with walking and/or transfers;A little help with bathing/dressing/bathroom;Assistance with cooking/housework;Assist for transportation;Help with stairs or ramp for entrance   Equipment Recommendations  Rolling walker (2 wheels)    Recommendations for Other Services       Precautions / Restrictions Precautions Precautions: Fall Restrictions Weight Bearing Restrictions: No RLE Weight Bearing: Weight bearing as tolerated     Mobility  Bed Mobility Overal bed mobility: Needs Assistance Bed Mobility: Sit to Supine     Supine to sit: Supervision Sit to supine: Supervision   General bed mobility comments: No physical assist; pt self assisting R LE with L LE    Transfers Overall transfer level: Needs assistance Equipment used: Rolling walker (2 wheels) Transfers: Sit to/from Stand Sit to Stand: Supervision           General transfer comment: min cues for LE  management and use of UEs to self assist    Ambulation/Gait Ambulation/Gait assistance: Min guard, Supervision Gait Distance (Feet): 115 Feet Assistive device: Rolling walker (2 wheels) Gait Pattern/deviations: Step-to pattern, Decreased stance time - right, Antalgic Gait velocity: decr     General Gait Details: min cues for posture and position from RW;   Stairs             Wheelchair Mobility    Modified Rankin (Stroke Patients Only)       Balance Overall balance assessment: Needs assistance Sitting-balance support: No upper extremity supported, Feet supported Sitting balance-Leahy Scale: Good     Standing balance support: No upper extremity supported Standing balance-Leahy Scale: Fair                              Cognition Arousal/Alertness: Awake/alert Behavior During Therapy: WFL for tasks assessed/performed Overall Cognitive Status: Within Functional Limits for tasks assessed                                          Exercises Total Joint Exercises Ankle Circles/Pumps: AROM, Both, 15 reps, Supine Quad Sets: AROM, 10 reps, Supine, Both Heel Slides: AAROM, Right, Supine, Other reps (comment) Hip ABduction/ADduction: AAROM, Right, Supine, 20 reps Long Arc Quad: AAROM, Right, 15 reps, Seated    General Comments        Pertinent Vitals/Pain Pain Assessment Pain Assessment: 0-10 Pain Score: 3  Pain Location: right hip Pain Descriptors / Indicators: Aching, Sore Pain Intervention(s): Monitored during session,  Limited activity within patient's tolerance, Premedicated before session, Ice applied    Home Living                          Prior Function            PT Goals (current goals can now be found in the care plan section) Acute Rehab PT Goals Patient Stated Goal: Go back to work PT Goal Formulation: With patient Time For Goal Achievement: 01/25/22 Potential to Achieve Goals: Good Progress towards PT  goals: Progressing toward goals    Frequency    7X/week      PT Plan Current plan remains appropriate    Co-evaluation              AM-PAC PT "6 Clicks" Mobility   Outcome Measure  Help needed turning from your back to your side while in a flat bed without using bedrails?: A Little Help needed moving from lying on your back to sitting on the side of a flat bed without using bedrails?: A Little Help needed moving to and from a bed to a chair (including a wheelchair)?: A Little Help needed standing up from a chair using your arms (e.g., wheelchair or bedside chair)?: A Little Help needed to walk in hospital room?: A Little Help needed climbing 3-5 steps with a railing? : A Little 6 Click Score: 18    End of Session Equipment Utilized During Treatment: Gait belt Activity Tolerance: Other (comment);Patient tolerated treatment well Patient left: in bed;with call bell/phone within reach;with family/visitor present;with nursing/sitter in room Nurse Communication: Mobility status PT Visit Diagnosis: Pain;Difficulty in walking, not elsewhere classified (R26.2) Pain - Right/Left: Right Pain - part of body: Hip     Time: 1436-1510 PT Time Calculation (min) (ACUTE ONLY): 34 min  Charges:  $Gait Training: 8-22 mins $Therapeutic Exercise: 8-22 mins                     [place] Pager [phone] Office [phone]    Ashaya Raftery 01/23/2022, 4:02 PM

## 2022-01-23 NOTE — Plan of Care (Signed)
  Problem: Activity: Goal: Risk for activity intolerance will decrease Outcome: Progressing   Problem: Pain Managment: Goal: General experience of comfort will improve Outcome: Progressing   Problem: Safety: Goal: Ability to remain free from injury will improve Outcome: Progressing   

## 2022-01-23 NOTE — Progress Notes (Signed)
Physical Therapy Treatment Patient Details Name: Clinton Hurley MRN: [id number] DOB: Aug 07, 1950 Today's Date: 01/23/2022   History of Present Illness Pt is a 71yo male presenting s/p R-THAA on 01/18/22. PMH: afib, GERD, HTN, OSA,    PT Comments    Pt feeling better and assisted up to mobilize with c/o fluctuating level of dizziness and BP as follows.  Supine 124/64; sit 115/70; stand 98/59; after walking 100' 108/59 with HR elevated to 120 and up in recliner 138/76.   Recommendations for follow up therapy are one component of a multi-disciplinary discharge planning process, led by the attending physician.  Recommendations may be updated based on patient status, additional functional criteria and insurance authorization.  Follow Up Recommendations  Follow physician's recommendations for discharge plan and follow up therapies     Assistance Recommended at Discharge Intermittent Supervision/Assistance  Patient can return home with the following A little help with walking and/or transfers;A little help with bathing/dressing/bathroom;Assistance with cooking/housework;Assist for transportation;Help with stairs or ramp for entrance   Equipment Recommendations  Rolling walker (2 wheels)    Recommendations for Other Services       Precautions / Restrictions Precautions Precautions: Fall Restrictions Weight Bearing Restrictions: No RLE Weight Bearing: Weight bearing as tolerated Other Position/Activity Restrictions: WBAT     Mobility  Bed Mobility Overal bed mobility: Needs Assistance Bed Mobility: Supine to Sit     Supine to sit: Supervision     General bed mobility comments: No physical assist; pt self assisting R LE with L LE    Transfers Overall transfer level: Needs assistance Equipment used: Rolling walker (2 wheels) Transfers: Sit to/from Stand Sit to Stand: Min guard           General transfer comment: min cues for LE management and use of UEs to self assist     Ambulation/Gait Ambulation/Gait assistance: Min guard Gait Distance (Feet): 100 Feet Assistive device: Rolling walker (2 wheels) Gait Pattern/deviations: Step-to pattern, Decreased stance time - right, Antalgic Gait velocity: decr     General Gait Details: min cues for posture and position from RW;   Stairs             Wheelchair Mobility    Modified Rankin (Stroke Patients Only)       Balance Overall balance assessment: Needs assistance Sitting-balance support: No upper extremity supported, Feet supported Sitting balance-Leahy Scale: Good     Standing balance support: No upper extremity supported Standing balance-Leahy Scale: Fair                              Cognition Arousal/Alertness: Awake/alert Behavior During Therapy: WFL for tasks assessed/performed Overall Cognitive Status: Within Functional Limits for tasks assessed                                          Exercises Total Joint Exercises Ankle Circles/Pumps: AROM, Both, 15 reps, Supine Heel Slides: AAROM, Right, Supine, Other reps (comment) (2 reps before requesting pain meds previously declined)    General Comments General comments (skin integrity, edema, etc.): dtr present      Pertinent Vitals/Pain Pain Assessment Pain Assessment: 0-10 Pain Score: 4  Pain Location: right hip Pain Descriptors / Indicators: Aching, Sore Pain Intervention(s): Limited activity within patient's tolerance, Monitored during session, Premedicated before session    Home Living  Prior Function            PT Goals (current goals can now be found in the care plan section) Acute Rehab PT Goals Patient Stated Goal: Go back to work PT Goal Formulation: With patient Time For Goal Achievement: 01/25/22 Potential to Achieve Goals: Good Progress towards PT goals: Progressing toward goals    Frequency    7X/week      PT Plan Current plan  remains appropriate    Co-evaluation              AM-PAC PT "6 Clicks" Mobility   Outcome Measure  Help needed turning from your back to your side while in a flat bed without using bedrails?: A Little Help needed moving from lying on your back to sitting on the side of a flat bed without using bedrails?: A Little Help needed moving to and from a bed to a chair (including a wheelchair)?: A Little Help needed standing up from a chair using your arms (e.g., wheelchair or bedside chair)?: A Little Help needed to walk in hospital room?: A Little Help needed climbing 3-5 steps with a railing? : A Little 6 Click Score: 18    End of Session Equipment Utilized During Treatment: Gait belt Activity Tolerance: Other (comment);Patient tolerated treatment well (orthostatic) Patient left: in bed;with call bell/phone within reach;with family/visitor present;with nursing/sitter in room Nurse Communication: Mobility status PT Visit Diagnosis: Pain;Difficulty in walking, not elsewhere classified (R26.2) Pain - Right/Left: Right Pain - part of body: Hip     Time: 7425-9563 PT Time Calculation (min) (ACUTE ONLY): 31 min  Charges:  $Gait Training: 23-37 mins $Therapeutic Activity: 8-22 mins                     [place] Pager [phone] Office [phone]    Dawnita Molner 01/23/2022, 1:04 PM

## 2022-01-24 LAB — CBC
HCT: 30.8 % — ABNORMAL LOW (ref 39.0–52.0)
Hemoglobin: 10.4 g/dL — ABNORMAL LOW (ref 13.0–17.0)
MCH: 31.9 pg (ref 26.0–34.0)
MCHC: 33.8 g/dL (ref 30.0–36.0)
MCV: 94.5 fL (ref 80.0–100.0)
Platelets: 204 10*3/uL (ref 150–400)
RBC: 3.26 MIL/uL — ABNORMAL LOW (ref 4.22–5.81)
RDW: 13.4 % (ref 11.5–15.5)
WBC: 9.6 10*3/uL (ref 4.0–10.5)
nRBC: 0 % (ref 0.0–0.2)

## 2022-01-24 NOTE — Progress Notes (Signed)
Pt continues to decline CPAP QHS.  Pt asked RT to remove CPAP machine from room.

## 2022-01-24 NOTE — Progress Notes (Signed)
Patient ID: ACY ORSAK, male   DOB: [birth year], 71 y.o.   MRN: [id number] Subjective: 6 Days Post-Op Procedure(s) (LRB): TOTAL HIP ARTHROPLASTY ANTERIOR APPROACH (Right)    Patient reports pain as mild at this point. Still dealing with orthostatic symptoms particularly in the morning He notes that he did pretty well walking the hall last night but felt dizzy this morning. No events  Objective:   VITALS:   Vitals:   01/23/22 0502 01/23/22 2047  BP: (!) 136/58 (!) 149/63  Pulse: 70 81  Resp: 18 16  Temp: 98.6 F (37 C) 99 F (37.2 C)  SpO2: 96% 96%    Neurovascular intact Incision: dressing C/D/I  LABS Recent Labs    01/24/22 0317  HGB 10.4*  HCT 30.8*  WBC 9.6  PLT 204    No results for input(s): "NA", "K", "BUN", "CREATININE", "GLUCOSE" in the last 72 hours.  No results for input(s): "LABPT", "INR" in the last 72 hours.   Assessment/Plan: 6 Days Post-Op Procedure(s) (LRB): TOTAL HIP ARTHROPLASTY ANTERIOR APPROACH (Right)   Up with therapy I rechecked his Hgb and find that it is stable  Review of his meds reveal that he was recently started on Alfuzosin (6 weeks ago he thinks) He also take dronedarone fpr afib Both of these may contribute to dizziness or orthostasis I have stopped the alfuzosin, unfortunately, this is taken at night so we may not see the effects until tomorrow  Continue to work with therapy towards safe home discharge

## 2022-01-24 NOTE — Plan of Care (Signed)
  Problem: Education: Goal: Knowledge of General Education information will improve Description: Including pain rating scale, medication(s)/side effects and non-pharmacologic comfort measures Outcome: Progressing   Problem: Health Behavior/Discharge Planning: Goal: Ability to manage health-related needs will improve Outcome: Progressing   Problem: Clinical Measurements: Goal: Ability to maintain clinical measurements within normal limits will improve Outcome: Progressing Goal: Will remain free from infection Outcome: Progressing Goal: Diagnostic test results will improve Outcome: Progressing Goal: Cardiovascular complication will be avoided Outcome: Progressing   Problem: Activity: Goal: Risk for activity intolerance will decrease Outcome: Progressing   Problem: Pain Managment: Goal: General experience of comfort will improve Outcome: Progressing   Problem: Safety: Goal: Ability to remain free from injury will improve Outcome: Progressing   Problem: Education: Goal: Knowledge of the prescribed therapeutic regimen will improve Outcome: Progressing Goal: Understanding of discharge needs will improve Outcome: Progressing   Problem: Activity: Goal: Ability to avoid complications of mobility impairment will improve Outcome: Progressing Goal: Ability to tolerate increased activity will improve Outcome: Progressing   Problem: Clinical Measurements: Goal: Postoperative complications will be avoided or minimized Outcome: Progressing   Problem: Pain Management: Goal: Pain level will decrease with appropriate interventions Outcome: Progressing   Problem: Skin Integrity: Goal: Will show signs of wound healing Outcome: Progressing

## 2022-01-24 NOTE — Progress Notes (Signed)
Physical Therapy Treatment Patient Details Name: Clinton Clinton MRN: [id number] DOB: 09/03/1950 Today's Date: 01/24/2022   History of Present Illness Pt is a 71yo male presenting s/p R-THAA on 01/18/22. PMH: afib, GERD, HTN, OSA,    PT Comments    Pt remains limited d/t decr BP/orthostatics.  See flowsheets.   Recommendations for follow up therapy are one component of a multi-disciplinary discharge planning process, led by the attending physician.  Recommendations may be updated based on patient status, additional functional criteria and insurance authorization.  Follow Up Recommendations  Follow physician's recommendations for discharge plan and follow up therapies     Assistance Recommended at Discharge Intermittent Supervision/Assistance  Patient can return home with the following A little help with walking and/or transfers;A little help with bathing/dressing/bathroom;Assistance with cooking/housework;Assist for transportation;Help with stairs or ramp for entrance   Equipment Recommendations  Rolling walker (2 wheels)    Recommendations for Other Services       Precautions / Restrictions Precautions Precautions: Fall Restrictions Weight Bearing Restrictions: No RLE Weight Bearing: Weight bearing as tolerated     Mobility  Bed Mobility Overal bed mobility: Needs Assistance Bed Mobility: Supine to Sit     Supine to sit: Supervision, Modified independent (Device/Increase time)     General bed mobility comments: No physical assist; pt self assisting R LE with L LE    Transfers Overall transfer level: Needs assistance Equipment used: Rolling walker (2 wheels) Transfers: Sit to/from Stand, Bed to chair/wheelchair/BSC Sit to Stand: Supervision   Step pivot transfers: Min guard       General transfer comment: min cues for LE management and use of UEs to self assist; limtied to transfers d/t decr BP.    Ambulation/Gait               General Gait Details:  unable d/t decr BP   Stairs             Wheelchair Mobility    Modified Rankin (Stroke Patients Only)       Balance Overall balance assessment: Needs assistance Sitting-balance support: No upper extremity supported, Feet supported Sitting balance-Leahy Scale: Good     Standing balance support: No upper extremity supported Standing balance-Leahy Scale: Fair                              Cognition Arousal/Alertness: Awake/alert Behavior During Therapy: WFL for tasks assessed/performed Overall Cognitive Status: Within Functional Limits for tasks assessed                                          Exercises      General Comments        Pertinent Vitals/Pain Pain Assessment Pain Assessment: 0-10 Pain Location: right hip Pain Descriptors / Indicators: Aching, Sore    Home Living                          Prior Function            PT Goals (current goals can now be found in the care plan section) Acute Rehab PT Goals Patient Stated Goal: Go back to work PT Goal Formulation: With patient Time For Goal Achievement: 01/25/22 Potential to Achieve Goals: Good Progress towards PT goals: Progressing toward goals    Frequency  7X/week      PT Plan Current plan remains appropriate    Co-evaluation              AM-PAC PT "6 Clicks" Mobility   Outcome Measure  Help needed turning from your back to your side while in a flat bed without using bedrails?: A Little Help needed moving from lying on your back to sitting on the side of a flat bed without using bedrails?: A Little Help needed moving to and from a bed to a chair (including a wheelchair)?: A Little Help needed standing up from a chair using your arms (e.g., wheelchair or bedside chair)?: A Little Help needed to walk in hospital room?: A Little Help needed climbing 3-5 steps with a railing? : A Little 6 Click Score: 18    End of Session Equipment  Utilized During Treatment: Gait belt Activity Tolerance: Other (comment);Patient tolerated treatment well Patient left: in chair;with call bell/phone within reach;with chair alarm set;with family/visitor present Nurse Communication: Mobility status PT Visit Diagnosis: Pain;Difficulty in walking, not elsewhere classified (R26.2) Pain - Right/Left: Right Pain - part of body: Hip     Time: 2878-6767 PT Time Calculation (min) (ACUTE ONLY): 15 min  Charges:  $Therapeutic Activity: 8-22 mins                     Baxter Flattery, PT  Acute Rehab Dept [facility]) [phone]  [facility] Weekend Pager ([place] only)  [phone]  01/24/2022    [facility] 01/24/2022, 1:51 PM

## 2022-01-24 NOTE — Progress Notes (Signed)
Physical Therapy Treatment Patient Details Name: Clinton Hurley MRN: [id number] DOB: Jul 10, 1950 Today's Date: 01/24/2022   History of Present Illness Pt is a 71yo male presenting s/p R-THAA on 01/18/22. PMH: afib, GERD, HTN, OSA,    PT Comments    Continued dizziness, incr amb distance without incr dizziness; pt is having some dizziness with  head turns/gaze change. Will continue to monitor next session. Pt and wife report having dizziness since havign Covid in October  Recommendations for follow up therapy are one component of a multi-disciplinary discharge planning process, led by the attending physician.  Recommendations may be updated based on patient status, additional functional criteria and insurance authorization.  Follow Up Recommendations  Follow physician's recommendations for discharge plan and follow up therapies     Assistance Recommended at Discharge Intermittent Supervision/Assistance  Patient can return home with the following A little help with walking and/or transfers;A little help with bathing/dressing/bathroom;Assistance with cooking/housework;Assist for transportation;Help with stairs or ramp for entrance   Equipment Recommendations  Rolling walker (2 wheels)    Recommendations for Other Services       Precautions / Restrictions Precautions Precautions: Fall Restrictions Weight Bearing Restrictions: No RLE Weight Bearing: Weight bearing as tolerated     Mobility  Bed Mobility Overal bed mobility: Needs Assistance Bed Mobility: Sit to Supine     Supine to sit: Supervision, Modified independent (Device/Increase time) Sit to supine: Supervision   General bed mobility comments: No physical assist; supervision for safety    Transfers Overall transfer level: Needs assistance Equipment used: Rolling walker (2 wheels) Transfers: Sit to/from Stand, Bed to chair/wheelchair/BSC Sit to Stand: Supervision   Step pivot transfers: Min guard        General transfer comment: min cues for LE management and use of UEs to self assist;    Ambulation/Gait Ambulation/Gait assistance: Min guard, Supervision Gait Distance (Feet): 120 Feet Assistive device: Rolling walker (2 wheels) Gait Pattern/deviations: Step-to pattern, Decreased stance time - right, Antalgic Gait velocity: decr     General Gait Details: occasional step through, some dizziness reported, mild nausea, dizziness did not worsen with amb, incr slightly with head turns.   Stairs             Wheelchair Mobility    Modified Rankin (Stroke Patients Only)       Balance Overall balance assessment: Needs assistance Sitting-balance support: No upper extremity supported, Feet supported Sitting balance-Leahy Scale: Good     Standing balance support: No upper extremity supported Standing balance-Leahy Scale: Fair                              Cognition Arousal/Alertness: Awake/alert Behavior During Therapy: WFL for tasks assessed/performed Overall Cognitive Status: Within Functional Limits for tasks assessed                                          Exercises Total Joint Exercises Ankle Circles/Pumps: AROM, Both, 15 reps, Supine Quad Sets: AROM, 10 reps, Supine, Both Heel Slides: AAROM, Right, Supine, Other reps (comment), 10 reps Hip ABduction/ADduction: AAROM, AROM, Right, 10 reps    General Comments        Pertinent Vitals/Pain Pain Assessment Pain Assessment: 0-10 Pain Score: 4  Pain Location: right hip Pain Descriptors / Indicators: Aching, Sore Pain Intervention(s): Limited activity within patient's tolerance, Monitored during session,  Premedicated before session, Ice applied    Home Living                          Prior Function            PT Goals (current goals can now be found in the care plan section) Acute Rehab PT Goals Patient Stated Goal: Go back to work PT Goal Formulation: With  patient Time For Goal Achievement: 01/25/22 Potential to Achieve Goals: Good Progress towards PT goals: Progressing toward goals    Frequency    7X/week      PT Plan Current plan remains appropriate    Co-evaluation              AM-PAC PT "6 Clicks" Mobility   Outcome Measure  Help needed turning from your back to your side while in a flat bed without using bedrails?: A Little Help needed moving from lying on your back to sitting on the side of a flat bed without using bedrails?: A Little Help needed moving to and from a bed to a chair (including a wheelchair)?: A Little Help needed standing up from a chair using your arms (e.g., wheelchair or bedside chair)?: A Little Help needed to walk in hospital room?: A Little Help needed climbing 3-5 steps with a railing? : A Little 6 Click Score: 18    End of Session Equipment Utilized During Treatment: Gait belt Activity Tolerance: Other (comment);Patient tolerated treatment well Patient left: with call bell/phone within reach;with family/visitor present;in bed;with bed alarm set Nurse Communication: Mobility status PT Visit Diagnosis: Pain;Difficulty in walking, not elsewhere classified (R26.2) Pain - Right/Left: Right Pain - part of body: Hip     Time: 4235-3614 PT Time Calculation (min) (ACUTE ONLY): 27 min  Charges:  $Gait Training: 8-22 mins $Therapeutic Exercise: 8-22 mins $Therapeutic Activity: 8-22 mins                     Baxter Flattery, PT  Acute Rehab Dept [facility]) [phone]  [facility] Weekend Pager ([place] only)  [phone]  01/24/2022    [facility] 01/24/2022, 3:18 PM

## 2022-01-25 ENCOUNTER — Encounter (HOSPITAL_COMMUNITY): Payer: Self-pay | Admitting: Orthopedic Surgery

## 2022-01-25 MED ORDER — SULFAMETHOXAZOLE-TRIMETHOPRIM 800-160 MG PO TABS: 1.0000 | ORAL_TABLET | Freq: Two times a day (BID) | ORAL | 0 refills | Status: AC

## 2022-01-25 NOTE — Plan of Care (Signed)
Plan of care reviewed and discussed with the patient. 

## 2022-01-25 NOTE — Progress Notes (Signed)
Physical Therapy Treatment Patient Details Name: Clinton Hurley MRN: [id number] DOB: 13-Mar-1950 Today's Date: 01/25/2022   History of Present Illness Pt is a 71yo male presenting s/p R-THAA on 01/18/22. PMH: afib, GERD, HTN, OSA,    PT Comments    Pt is feeling well today, denies dizziness or other symptoms with hallway amb. Pain well controlled. Verbally reviewed HEP and progression. Discussed safety with transitions at home and pt verbalizes understanding.   Pt is ready to d/c with family assist from PT standpoint.   Recommendations for follow up therapy are one component of a multi-disciplinary discharge planning process, led by the attending physician.  Recommendations may be updated based on patient status, additional functional criteria and insurance authorization.  Follow Up Recommendations  Follow physician's recommendations for discharge plan and follow up therapies     Assistance Recommended at Discharge Intermittent Supervision/Assistance  Patient can return home with the following A little help with walking and/or transfers;A little help with bathing/dressing/bathroom;Assistance with cooking/housework;Assist for transportation;Help with stairs or ramp for entrance   Equipment Recommendations  Rolling walker (2 wheels)    Recommendations for Other Services       Precautions / Restrictions Precautions Precautions: Fall Restrictions Weight Bearing Restrictions: No RLE Weight Bearing: Weight bearing as tolerated     Mobility  Bed Mobility               General bed mobility comments: in recliner    Transfers Overall transfer level: Needs assistance Equipment used: Rolling walker (2 wheels) Transfers: Sit to/from Stand, Bed to chair/wheelchair/BSC Sit to Stand: Supervision, Modified independent (Device/Increase time)           General transfer comment: self cues for correct hand placement demonstrating carryover from previous sessions     Ambulation/Gait Ambulation/Gait assistance: Supervision, Modified independent (Device/Increase time) Gait Distance (Feet): 120 Feet Assistive device: Rolling walker (2 wheels) Gait Pattern/deviations: Step-to pattern, Decreased stance time - right, Antalgic Gait velocity: decr     General Gait Details: no dizziness, beginning step through pattern   Stairs         General stair comments: pt verbalizes technique and reports he is comfortable withno need to practice again   Wheelchair Mobility    Modified Rankin (Stroke Patients Only)       Balance Overall balance assessment: Needs assistance Sitting-balance support: No upper extremity supported, Feet supported Sitting balance-Leahy Scale: Good     Standing balance support: No upper extremity supported Standing balance-Leahy Scale: Fair                              Cognition Arousal/Alertness: Awake/alert Behavior During Therapy: WFL for tasks assessed/performed Overall Cognitive Status: Within Functional Limits for tasks assessed                                          Exercises Total Joint Exercises Long Arc Quad: AROM, Right, 5 reps    General Comments        Pertinent Vitals/Pain Pain Assessment Pain Assessment: 0-10 Pain Score: 5  Pain Location: right hip Pain Descriptors / Indicators: Aching, Sore Pain Intervention(s): Limited activity within patient's tolerance, Monitored during session, Premedicated before session, Repositioned, Ice applied    Home Living  Prior Function            PT Goals (current goals can now be found in the care plan section) Acute Rehab PT Goals Patient Stated Goal: Go back to work PT Goal Formulation: With patient Time For Goal Achievement: 01/25/22 Potential to Achieve Goals: Good Progress towards PT goals: Progressing toward goals    Frequency    7X/week      PT Plan Current plan remains  appropriate    Co-evaluation              AM-PAC PT "6 Clicks" Mobility   Outcome Measure  Help needed turning from your back to your side while in a flat bed without using bedrails?: A Little Help needed moving from lying on your back to sitting on the side of a flat bed without using bedrails?: A Little Help needed moving to and from a bed to a chair (including a wheelchair)?: A Little Help needed standing up from a chair using your arms (e.g., wheelchair or bedside chair)?: A Little Help needed to walk in hospital room?: A Little Help needed climbing 3-5 steps with a railing? : A Little 6 Click Score: 18    End of Session Equipment Utilized During Treatment: Gait belt Activity Tolerance: Patient tolerated treatment well Patient left: with call bell/phone within reach;in chair;with chair alarm set Nurse Communication: Mobility status PT Visit Diagnosis: Pain;Difficulty in walking, not elsewhere classified (R26.2) Pain - Right/Left: Right Pain - part of body: Hip     Time: 4403-4742 PT Time Calculation (min) (ACUTE ONLY): 14 min  Charges:  $Gait Training: 8-22 mins                     Baxter Flattery, PT  Acute Rehab Dept [facility]) [phone]  [facility] Weekend Pager ([place] only)  [phone]  01/25/2022    [facility] 01/25/2022, 9:50 AM

## 2022-01-25 NOTE — Progress Notes (Signed)
Patient ID: Clinton Hurley, male   DOB: 1950/05/17, 71 y.o.   MRN: [id number] Subjective: 7 Days Post-Op Procedure(s) (LRB): TOTAL HIP ARTHROPLASTY ANTERIOR APPROACH (Right)    Patient reports pain as mild. He feels that he is getting better and ready to go home today. We reviewed his medication list with plans to follow up with his Urologist regarding his neurogenic bladder and general BPH related concerns  Objective:   VITALS:   Vitals:   01/24/22 2243 01/25/22 0545  BP: 130/62 125/61  Pulse: 76 73  Resp: 17 17  Temp: 98.6 F (37 C) 98.7 F (37.1 C)  SpO2: 97% 94%    Neurovascular intact Incision: dressing C/D/I  LABS Recent Labs    01/24/22 0317  HGB 10.4*  HCT 30.8*  WBC 9.6  PLT 204    No results for input(s): "NA", "K", "BUN", "CREATININE", "GLUCOSE" in the last 72 hours.  No results for input(s): "LABPT", "INR" in the last 72 hours.   Assessment/Plan: 7 Days Post-Op Procedure(s) (LRB): TOTAL HIP ARTHROPLASTY ANTERIOR APPROACH (Right)   Up with therapy Home today after therapy RTC next week Scheduled follow up with his Urologist He may shower this am prior to departure

## 2022-02-01 NOTE — Discharge Summary (Signed)
Physician Discharge Summary   Patient ID: Clinton Hurley MRN: [id number] DOB/AGE: 1950-10-13 70 y.o.  Admit date: 01/18/2022 Discharge date: 01/25/2022  Primary Diagnosis: Right hip osteoarthritis  Admission Diagnoses:  Past Medical History:  Diagnosis Date   Anxiety    Arthritis    Atrial fibrillation ([place])    Complication of anesthesia    long time to wake up post chole   GERD (gastroesophageal reflux disease)    Hypertension    Sleep apnea    Discharge Diagnoses:   Principal Problem:   S/P total right hip arthroplasty  Estimated body mass index is 33.74 kg/m as calculated from the following:   Height as of this encounter: '6\' 1"'$  (1.854 m).   Weight as of this encounter: 116 kg.  Procedure:  Procedure(s) (LRB): TOTAL HIP ARTHROPLASTY ANTERIOR APPROACH (Right)   Consults: None  HPI: Clinton Hurley is a 71 y.o. male who had   presented to office for evaluation of right hip pain.  Radiographs revealed   progressive degenerative changes with bone-on-bone   articulation of the  hip joint, including subchondral cystic changes and osteophytes.  The patient had painful limited range of   motion significantly affecting their overall quality of life and function.  The patient was failing to    respond to conservative measures including medications and/or injections and activity modification and at this point was ready   to proceed with more definitive measures.  Consent was obtained for   benefit of pain relief.  Specific risks of infection, DVT, component   failure, dislocation, neurovascular injury, and need for revision surgery were reviewed in the office.     Laboratory Data: Admission on 01/18/2022, Discharged on 01/25/2022  Component Date Value Ref Range Status   WBC 01/19/2022 16.1 (H)  4.0 - 10.5 K/uL Final   RBC 01/19/2022 3.33 (L)  4.22 - 5.81 MIL/uL Final   Hemoglobin 01/19/2022 10.5 (L)  13.0 - 17.0 g/dL Final   HCT 01/19/2022 32.5 (L)  39.0 - 52.0 % Final    MCV 01/19/2022 97.6  80.0 - 100.0 fL Final   MCH 01/19/2022 31.5  26.0 - 34.0 pg Final   MCHC 01/19/2022 32.3  30.0 - 36.0 g/dL Final   RDW 01/19/2022 13.2  11.5 - 15.5 % Final   Platelets 01/19/2022 188  150 - 400 K/uL Final   nRBC 01/19/2022 0.0  0.0 - 0.2 % Final   Performed at [facility], [place] [address], [place], [place] [address]   Sodium 01/19/2022 140  135 - 145 mmol/L Final   Potassium 01/19/2022 4.1  3.5 - 5.1 mmol/L Final   Chloride 01/19/2022 107  98 - 111 mmol/L Final   CO2 01/19/2022 25  22 - 32 mmol/L Final   Glucose, Bld 01/19/2022 138 (H)  70 - 99 mg/dL Final   Glucose reference range applies only to samples taken after fasting for at least 8 hours.   BUN 01/19/2022 21  8 - 23 mg/dL Final   Creatinine, Ser 01/19/2022 1.39 (H)  0.61 - 1.24 mg/dL Final   Calcium 01/19/2022 8.4 (L)  8.9 - 10.3 mg/dL Final   GFR, Estimated 01/19/2022 55 (L)  >60 mL/min Final   Comment: (NOTE) Calculated using the CKD-EPI Creatinine Equation (2021)    Anion gap 01/19/2022 8  5 - 15 Final   Performed at [facility], [place] [address], [place], [place] [address]   WBC 01/20/2022 15.1 (H)  4.0 - 10.5  K/uL Final   RBC 01/20/2022 3.20 (L)  4.22 - 5.81 MIL/uL Final   Hemoglobin 01/20/2022 10.2 (L)  13.0 - 17.0 g/dL Final   HCT 01/20/2022 30.4 (L)  39.0 - 52.0 % Final   MCV 01/20/2022 95.0  80.0 - 100.0 fL Final   MCH 01/20/2022 31.9  26.0 - 34.0 pg Final   MCHC 01/20/2022 33.6  30.0 - 36.0 g/dL Final   RDW 01/20/2022 13.1  11.5 - 15.5 % Final   Platelets 01/20/2022 170  150 - 400 K/uL Final   nRBC 01/20/2022 0.0  0.0 - 0.2 % Final   Performed at [facility], [place] [address] [place], [place] [address]   WBC 01/24/2022 9.6  4.0 - 10.5 K/uL Final   RBC 01/24/2022 3.26 (L)  4.22 - 5.81 MIL/uL Final   Hemoglobin 01/24/2022 10.4 (L)  13.0 - 17.0 g/dL Final   HCT 01/24/2022 30.8 (L)  39.0 - 52.0 % Final   MCV 01/24/2022 94.5  80.0 - 100.0 fL  Final   MCH 01/24/2022 31.9  26.0 - 34.0 pg Final   MCHC 01/24/2022 33.8  30.0 - 36.0 g/dL Final   RDW 01/24/2022 13.4  11.5 - 15.5 % Final   Platelets 01/24/2022 204  150 - 400 K/uL Final   nRBC 01/24/2022 0.0  0.0 - 0.2 % Final   Performed at [facility], [place] [address], [place], [place] [address]  Hospital Outpatient Visit on 01/07/2022  Component Date Value Ref Range Status   MRSA, PCR 01/07/2022 NEGATIVE  NEGATIVE Final   Staphylococcus aureus 01/07/2022 NEGATIVE  NEGATIVE Final   Comment: (NOTE) The Xpert SA Assay (FDA approved for NASAL specimens in patients 37 years of age and older), is one component of a comprehensive surveillance program. It is not intended to diagnose infection nor to guide or monitor treatment. Performed at [facility], [place] [address], [place], [place] [address]    Sodium 01/07/2022 138  135 - 145 mmol/L Final   Potassium 01/07/2022 3.8  3.5 - 5.1 mmol/L Final   Chloride 01/07/2022 102  98 - 111 mmol/L Final   CO2 01/07/2022 27  22 - 32 mmol/L Final   Glucose, Bld 01/07/2022 176 (H)  70 - 99 mg/dL Final   Glucose reference range applies only to samples taken after fasting for at least 8 hours.   BUN 01/07/2022 19  8 - 23 mg/dL Final   Creatinine, Ser 01/07/2022 1.57 (H)  0.61 - 1.24 mg/dL Final   Calcium 01/07/2022 9.0  8.9 - 10.3 mg/dL Final   GFR, Estimated 01/07/2022 47 (L)  >60 mL/min Final   Comment: (NOTE) Calculated using the CKD-EPI Creatinine Equation (2021)    Anion gap 01/07/2022 9  5 - 15 Final   Performed at [facility], [place] [address], [place], [place] [address]   WBC 01/07/2022 11.4 (H)  4.0 - 10.5 K/uL Final   RBC 01/07/2022 4.18 (L)  4.22 - 5.81 MIL/uL Final   Hemoglobin 01/07/2022 13.1  13.0 - 17.0 g/dL Final   HCT 01/07/2022 39.1  39.0 - 52.0 % Final   MCV 01/07/2022 93.5  80.0 - 100.0 fL Final   MCH 01/07/2022 31.3  26.0 - 34.0 pg Final   MCHC 01/07/2022 33.5  30.0 -  36.0 g/dL Final   RDW 01/07/2022 12.4  11.5 - 15.5 % Final   Platelets 01/07/2022 331  150 - 400 K/uL Final   nRBC 01/07/2022 0.0  0.0 - 0.2 % Final   Performed at [facility], [place] [address], [place], [place] [address]   ABO/RH(D) 01/07/2022 O POS   Final   Antibody Screen 01/07/2022 NEG   Final   Sample Expiration 01/07/2022 01/21/2022,2359   Final   Extend sample reason 01/07/2022    Final                   Value:NO TRANSFUSIONS OR PREGNANCY IN THE PAST 3 MONTHS Performed at [facility], [place] [address] [place], [place] [address]   Hospital Outpatient Visit on 12/17/2021  Component Date Value Ref Range Status   Sodium 12/17/2021 137  135 - 145 mmol/L Final   Potassium 12/17/2021 3.3 (L)  3.5 - 5.1 mmol/L Final   Chloride 12/17/2021 103  98 - 111 mmol/L Final   CO2 12/17/2021 26  22 - 32 mmol/L Final   Glucose, Bld 12/17/2021 141 (H)  70 - 99 mg/dL Final   Glucose reference range applies only to samples taken after fasting for at least 8 hours.   BUN 12/17/2021 23  8 - 23 mg/dL Final   Creatinine, Ser 12/17/2021 1.60 (H)  0.61 - 1.24 mg/dL Final   Calcium 12/17/2021 8.7 (L)  8.9 - 10.3 mg/dL Final   GFR, Estimated 12/17/2021 46 (L)  >60 mL/min Final   Comment: (NOTE) Calculated using the CKD-EPI Creatinine Equation (2021)    Anion gap 12/17/2021 8  5 - 15 Final   Performed at [facility], [place] [address] [place], [place] [address]   WBC 12/17/2021 9.3  4.0 - 10.5 K/uL Final   RBC 12/17/2021 4.04 (L)  4.22 - 5.81 MIL/uL Final   Hemoglobin 12/17/2021 12.8 (L)  13.0 - 17.0 g/dL Final   HCT 12/17/2021 37.7 (L)  39.0 - 52.0 % Final   MCV 12/17/2021 93.3  80.0 - 100.0 fL Final   MCH 12/17/2021 31.7  26.0 - 34.0 pg Final   MCHC 12/17/2021 34.0  30.0 - 36.0 g/dL Final   RDW 12/17/2021 12.7  11.5 - 15.5 % Final   Platelets 12/17/2021 212  150 - 400 K/uL Final   nRBC 12/17/2021 0.0  0.0 - 0.2 % Final   Performed at [facility], [place] [address] [place], [place] [address]   ABO/RH(D) 12/17/2021 O POS   Final   Antibody Screen 12/17/2021 NEG   Final   Sample Expiration 12/17/2021 12/24/2021,2359   Final   Extend sample reason 12/17/2021    Final                   Value:NO TRANSFUSIONS OR PREGNANCY IN THE PAST 3 MONTHS Performed at [place] [address], [place], [place] [address]    MRSA, PCR 12/17/2021 NEGATIVE  NEGATIVE Final   Staphylococcus aureus 12/17/2021 NEGATIVE  NEGATIVE Final   Comment: (NOTE) The Xpert SA Assay (FDA approved for NASAL specimens in patients 14 years of age and older), is one component of a comprehensive surveillance program. It is not intended to diagnose infection nor to guide or monitor treatment. Performed at [facility], [place] [address] [place], [place] [address]      X-Rays:DG Pelvis Portable  Result Date: 01/18/2022 CLINICAL DATA:  Status post right hip arthroplasty EXAM: PORTABLE PELVIS 1-2 VIEWS COMPARISON:  None Available. FINDINGS: There is evidence of recent right hip arthroplasty. There are pockets of air in the soft tissues. Degenerative changes  are noted in the left hip with bony spurs. IMPRESSION: Status post right hip arthroplasty. Electronically Signed   By: Elmer Picker M.D.   On: 01/18/2022 14:03   DG HIP UNILAT WITH PELVIS 1V RIGHT  Result Date: 01/18/2022 CLINICAL DATA:  RIGHT hip surgery EXAM: DG HIP (WITH OR WITHOUT PELVIS) 1V RIGHT COMPARISON:  None available Fluoroscopy time: 0 minutes 12 seconds Dose: 3.3559 mGy Images: 2 FINDINGS: Acetabular cup and femoral component of RIGHT hip prosthesis identified. Femoral head component not yet installed. No fracture or bone destruction. IMPRESSION: Intraoperative images during RIGHT hip arthroplasty. Electronically Signed   By: Lavonia [place] M.D.   On: 01/18/2022 13:15   DG C-Arm 1-60 Min-No Report  Result Date: 01/18/2022 Fluoroscopy was  utilized by the requesting physician.  No radiographic interpretation.   DG C-Arm 1-60 Min-No Report  Result Date: 01/18/2022 Fluoroscopy was utilized by the requesting physician.  No radiographic interpretation.    EKG: Orders placed or performed during the hospital encounter of 12/17/21   EKG 12 lead per protocol   EKG 12 lead per protocol     Hospital Course: Clinton Hurley is a 71 y.o. who was admitted to [facility]. They were brought to the operating room on 01/18/2022 and underwent Procedure(s): [place].  Patient tolerated the procedure well and was later transferred to the recovery room and then to the orthopaedic floor for postoperative care. They were given PO and IV analgesics for pain control following their surgery. They were given 24 hours of postoperative antibiotics of  Anti-infectives (From admission, onward)    Start     Dose/Rate Route Frequency Ordered Stop   01/25/22 0000  sulfamethoxazole-trimethoprim (BACTRIM DS) 800-160 MG tablet        1 tablet Oral Every 12 hours 01/25/22 1004     01/21/22 1000  sulfamethoxazole-trimethoprim (BACTRIM DS) 800-160 MG per tablet 1 tablet  Status:  Discontinued        1 tablet Oral Every 12 hours 01/21/22 0703 01/25/22 1803   01/18/22 1800  ceFAZolin (ANCEF) IVPB 2g/100 mL premix        2 g 200 mL/hr over 30 Minutes Intravenous Every 6 hours 01/18/22 1544 01/18/22 2348   01/18/22 0845  ceFAZolin (ANCEF) IVPB 2g/100 mL premix        2 g 200 mL/hr over 30 Minutes Intravenous On call to O.R. 01/18/22 1610 01/18/22 1134      and started on DVT prophylaxis in the form of  Eliquis .   PT and OT were ordered for total joint protocol. Discharge planning consulted to help with postop disposition and equipment needs. Patient had a fair night on the evening of surgery. They started to get up OOB with therapy on POD #0.  Continued to work with therapy into POD #1 with slow progression related to pain.   Continued working with PT on POD #2, #3, #4, but limited due to pain and mild orthostatic hypotension. Continued to work with PT on #5 and #6. Alfluzosin was discontinued on POD #6 due to concerns it may be contributing to hypotension.    Pt was seen during rounds on day seven and was ready to go home pending progress with therapy. Pt worked with therapy for one additional session and was meeting their goals. He was discharged to home later that day in stable condition.  Diet: Regular diet Activity: WBAT Follow-up: in 2 weeks Disposition: Home Discharged Condition: good   Discharge  Instructions     Call MD / Call 911   Complete by: As directed    If you experience chest pain or shortness of breath, CALL 911 and be transported to the hospital emergency room.  If you develope a fever above 101 F, pus (white drainage) or increased drainage or redness at the wound, or calf pain, call your surgeon's office.   Change dressing   Complete by: As directed    Maintain surgical dressing until follow up in the clinic. If the edges start to pull up, may reinforce with tape. If the dressing is no longer working, may remove and cover with gauze and tape, but must keep the area dry and clean.  Call with any questions or concerns.   Constipation Prevention   Complete by: As directed    Drink plenty of fluids.  Prune juice may be helpful.  You may use a stool softener, such as Colace (over the counter) 100 mg twice a day.  Use MiraLax (over the counter) for constipation as needed.   Diet - low sodium heart healthy   Complete by: As directed    Increase activity slowly as tolerated   Complete by: As directed    Weight bearing as tolerated with assist device (walker, cane, etc) as directed, use it as long as suggested by your surgeon or therapist, typically at least 4-6 weeks.   Post-operative opioid taper instructions:   Complete by: As directed    POST-OPERATIVE OPIOID TAPER INSTRUCTIONS: It is  important to wean off of your opioid medication as soon as possible. If you do not need pain medication after your surgery it is ok to stop day one. Opioids include: Codeine, Hydrocodone(Norco, Vicodin), Oxycodone(Percocet, oxycontin) and hydromorphone amongst others.  Long term and even short term use of opiods can cause: Increased pain response Dependence Constipation Depression Respiratory depression And more.  Withdrawal symptoms can include Flu like symptoms Nausea, vomiting And more Techniques to manage these symptoms Hydrate well Eat regular healthy meals Stay active Use relaxation techniques(deep breathing, meditating, yoga) Do Not substitute Alcohol to help with tapering If you have been on opioids for less than two weeks and do not have pain than it is ok to stop all together.  Plan to wean off of opioids This plan should start within one week post op of your joint replacement. Maintain the same interval or time between taking each dose and first decrease the dose.  Cut the total daily intake of opioids by one tablet each day Next start to increase the time between doses. The last dose that should be eliminated is the evening dose.      TED hose   Complete by: As directed    Use stockings (TED hose) for 2 weeks on both leg(s).  You may remove them at night for sleeping.      Allergies as of 01/25/2022   No Known Allergies      Medication List     STOP taking these medications    alfuzosin 10 MG 24 hr tablet Commonly known as: UROXATRAL   finasteride 5 MG tablet Commonly known as: PROSCAR       TAKE these medications    atorvastatin 80 MG tablet Commonly known as: LIPITOR Take 40 mg by mouth at bedtime.   B-12 PO Take 1 tablet by mouth daily.   clonazePAM 1 MG tablet Commonly known as: KLONOPIN Take 0.5 mg by mouth 2 (two) times daily.   docusate sodium 100  MG capsule Commonly known as: COLACE Take 1 capsule (100 mg total) by mouth 2 (two)  times daily.   dronedarone 400 MG tablet Commonly known as: MULTAQ Take 400 mg by mouth 2 (two) times daily with a meal.   Eliquis 5 MG Tabs tablet Generic drug: apixaban Take 5 mg by mouth 2 (two) times daily.   HYDROcodone-acetaminophen 7.5-325 MG tablet Commonly known as: NORCO Take 1 tablet by mouth every 4 (four) hours as needed for severe pain.   indapamide 2.5 MG tablet Commonly known as: LOZOL Take 2.5 mg by mouth daily.   magnesium oxide 400 (240 Mg) MG tablet Commonly known as: MAG-OX Take 400 mg by mouth daily.   multivitamin with minerals Tabs tablet Take 1 tablet by mouth daily.   NON FORMULARY Pt uses a cpap nightly   omeprazole 20 MG capsule Commonly known as: PRILOSEC Take 20 mg by mouth in the morning and at bedtime. Take 30 minutes before meal   polyethylene glycol 17 g packet Commonly known as: MIRALAX / GLYCOLAX Take 17 g by mouth 2 (two) times daily.   sulfamethoxazole-trimethoprim 800-160 MG tablet Commonly known as: BACTRIM DS Take 1 tablet by mouth every 12 (twelve) hours.   tiZANidine 4 MG tablet Commonly known as: ZANAFLEX Take 1 tablet (4 mg total) by mouth every 8 (eight) hours as needed for muscle spasms (muscle pain).               Discharge Care Instructions  (From admission, onward)           Start     Ordered   01/20/22 0000  Change dressing       Comments: Maintain surgical dressing until follow up in the clinic. If the edges start to pull up, may reinforce with tape. If the dressing is no longer working, may remove and cover with gauze and tape, but must keep the area dry and clean.  Call with any questions or concerns.   01/20/22 0931            Follow-up Information     Paralee Cancel, MD. Schedule an appointment as soon as possible for a visit in 2 week(s).   Specialty: Orthopedic Surgery Contact information: [address] [place] [address] [phone]                  Signed: Griffith Citron, PA-C Orthopedic Surgery 02/01/2022, 1:26 PM

## 2022-02-04 NOTE — Addendum Note (Signed)
Encounter addended by: Pricilla Larsson, [facility] on: 02/04/2022 1:11 PM  Actions taken: Order Reconciliation Section accessed

## 2022-03-16 DIAGNOSIS — Z5189 Encounter for other specified aftercare: Secondary | ICD-10-CM | POA: Diagnosis not present

## 2022-05-20 DIAGNOSIS — D2261 Melanocytic nevi of right upper limb, including shoulder: Secondary | ICD-10-CM | POA: Diagnosis not present

## 2022-05-20 DIAGNOSIS — L821 Other seborrheic keratosis: Secondary | ICD-10-CM | POA: Diagnosis not present

## 2022-05-20 DIAGNOSIS — D225 Melanocytic nevi of trunk: Secondary | ICD-10-CM | POA: Diagnosis not present

## 2022-05-20 DIAGNOSIS — C44529 Squamous cell carcinoma of skin of other part of trunk: Secondary | ICD-10-CM | POA: Diagnosis not present

## 2022-05-20 DIAGNOSIS — D2262 Melanocytic nevi of left upper limb, including shoulder: Secondary | ICD-10-CM | POA: Diagnosis not present

## 2022-05-20 DIAGNOSIS — D485 Neoplasm of uncertain behavior of skin: Secondary | ICD-10-CM | POA: Diagnosis not present

## 2022-05-20 DIAGNOSIS — L570 Actinic keratosis: Secondary | ICD-10-CM | POA: Diagnosis not present

## 2022-06-17 DIAGNOSIS — C44529 Squamous cell carcinoma of skin of other part of trunk: Secondary | ICD-10-CM | POA: Diagnosis not present

## 2022-09-13 DIAGNOSIS — H43812 Vitreous degeneration, left eye: Secondary | ICD-10-CM | POA: Diagnosis not present

## 2022-09-13 DIAGNOSIS — H2513 Age-related nuclear cataract, bilateral: Secondary | ICD-10-CM | POA: Diagnosis not present

## 2023-08-18 DIAGNOSIS — L570 Actinic keratosis: Secondary | ICD-10-CM | POA: Diagnosis not present

## 2023-08-18 DIAGNOSIS — D2262 Melanocytic nevi of left upper limb, including shoulder: Secondary | ICD-10-CM | POA: Diagnosis not present

## 2023-08-18 DIAGNOSIS — L821 Other seborrheic keratosis: Secondary | ICD-10-CM | POA: Diagnosis not present

## 2023-08-18 DIAGNOSIS — Z85828 Personal history of other malignant neoplasm of skin: Secondary | ICD-10-CM | POA: Diagnosis not present

## 2023-08-18 DIAGNOSIS — D225 Melanocytic nevi of trunk: Secondary | ICD-10-CM | POA: Diagnosis not present

## 2023-08-18 DIAGNOSIS — D2261 Melanocytic nevi of right upper limb, including shoulder: Secondary | ICD-10-CM | POA: Diagnosis not present

## 2023-08-18 DIAGNOSIS — Z08 Encounter for follow-up examination after completed treatment for malignant neoplasm: Secondary | ICD-10-CM | POA: Diagnosis not present
